# Patient Record
Sex: Male | Born: 1970 | Race: Black or African American | Hispanic: No | Marital: Single | Smoking: Current every day smoker
Health system: Southern US, Community
[De-identification: ages and names within clinical notes are randomized; demographics above are authoritative.]

## PROBLEM LIST (undated history)

## (undated) DIAGNOSIS — N189 Chronic kidney disease, unspecified: Secondary | ICD-10-CM

## (undated) DIAGNOSIS — M1990 Unspecified osteoarthritis, unspecified site: Secondary | ICD-10-CM

## (undated) DIAGNOSIS — S8290XA Unspecified fracture of unspecified lower leg, initial encounter for closed fracture: Secondary | ICD-10-CM

## (undated) DIAGNOSIS — M869 Osteomyelitis, unspecified: Secondary | ICD-10-CM

## (undated) DIAGNOSIS — I10 Essential (primary) hypertension: Secondary | ICD-10-CM

## (undated) DIAGNOSIS — I2699 Other pulmonary embolism without acute cor pulmonale: Secondary | ICD-10-CM

## (undated) DIAGNOSIS — Z9289 Personal history of other medical treatment: Secondary | ICD-10-CM

## (undated) DIAGNOSIS — I82409 Acute embolism and thrombosis of unspecified deep veins of unspecified lower extremity: Secondary | ICD-10-CM

## (undated) HISTORY — PX: KNEE HARDWARE REMOVAL: SUR1128

---
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
## 2012-10-10 ENCOUNTER — Emergency Department (HOSPITAL_COMMUNITY): Payer: Medicaid Other

## 2012-10-10 ENCOUNTER — Encounter (HOSPITAL_COMMUNITY): Payer: Self-pay

## 2012-10-10 ENCOUNTER — Inpatient Hospital Stay (HOSPITAL_COMMUNITY)
Admission: EM | Admit: 2012-10-10 | Discharge: 2012-10-24 | DRG: 464 | Disposition: A | Discharge status: Home Health | Payer: Medicaid Other | Attending: Orthopedic Surgery | Admitting: Orthopedic Surgery

## 2012-10-10 DIAGNOSIS — F1010 Alcohol abuse, uncomplicated: Secondary | ICD-10-CM | POA: Diagnosis present

## 2012-10-10 DIAGNOSIS — S82209A Unspecified fracture of shaft of unspecified tibia, initial encounter for closed fracture: Principal | ICD-10-CM | POA: Diagnosis present

## 2012-10-10 DIAGNOSIS — Y92410 Unspecified street and highway as the place of occurrence of the external cause: Secondary | ICD-10-CM

## 2012-10-10 DIAGNOSIS — I10 Essential (primary) hypertension: Secondary | ICD-10-CM | POA: Diagnosis present

## 2012-10-10 DIAGNOSIS — T79A29A Traumatic compartment syndrome of unspecified lower extremity, initial encounter: Secondary | ICD-10-CM | POA: Diagnosis present

## 2012-10-10 DIAGNOSIS — I517 Cardiomegaly: Secondary | ICD-10-CM

## 2012-10-10 DIAGNOSIS — Z9119 Patient's noncompliance with other medical treatment and regimen: Secondary | ICD-10-CM

## 2012-10-10 DIAGNOSIS — I2699 Other pulmonary embolism without acute cor pulmonale: Secondary | ICD-10-CM | POA: Diagnosis present

## 2012-10-10 DIAGNOSIS — E860 Dehydration: Secondary | ICD-10-CM | POA: Diagnosis present

## 2012-10-10 DIAGNOSIS — Z91199 Patient's noncompliance with other medical treatment and regimen due to unspecified reason: Secondary | ICD-10-CM

## 2012-10-10 DIAGNOSIS — Y998 Other external cause status: Secondary | ICD-10-CM

## 2012-10-10 DIAGNOSIS — I161 Hypertensive emergency: Secondary | ICD-10-CM | POA: Diagnosis present

## 2012-10-10 DIAGNOSIS — S82409A Unspecified fracture of shaft of unspecified fibula, initial encounter for closed fracture: Principal | ICD-10-CM | POA: Diagnosis present

## 2012-10-10 DIAGNOSIS — F1410 Cocaine abuse, uncomplicated: Secondary | ICD-10-CM | POA: Diagnosis present

## 2012-10-10 DIAGNOSIS — D62 Acute posthemorrhagic anemia: Secondary | ICD-10-CM | POA: Diagnosis not present

## 2012-10-10 DIAGNOSIS — S4350XA Sprain of unspecified acromioclavicular joint, initial encounter: Secondary | ICD-10-CM | POA: Diagnosis present

## 2012-10-10 DIAGNOSIS — Z86711 Personal history of pulmonary embolism: Secondary | ICD-10-CM

## 2012-10-10 DIAGNOSIS — N179 Acute kidney failure, unspecified: Secondary | ICD-10-CM | POA: Diagnosis present

## 2012-10-10 DIAGNOSIS — F17200 Nicotine dependence, unspecified, uncomplicated: Secondary | ICD-10-CM | POA: Diagnosis present

## 2012-10-10 DIAGNOSIS — S82202A Unspecified fracture of shaft of left tibia, initial encounter for closed fracture: Secondary | ICD-10-CM | POA: Diagnosis present

## 2012-10-10 HISTORY — DX: Essential (primary) hypertension: I10

## 2012-10-10 HISTORY — DX: Osteomyelitis, unspecified: M86.9

## 2012-10-10 HISTORY — DX: Acute embolism and thrombosis of unspecified deep veins of unspecified lower extremity: I82.409

## 2012-10-10 HISTORY — DX: Other pulmonary embolism without acute cor pulmonale: I26.99

## 2012-10-10 LAB — RAPID URINE DRUG SCREEN, HOSP PERFORMED
Amphetamines: NOT DETECTED
Opiates: NOT DETECTED

## 2012-10-10 LAB — POCT I-STAT, CHEM 8
Calcium, Ion: 1.06 mmol/L — ABNORMAL LOW (ref 1.12–1.23)
Chloride: 107 mEq/L (ref 96–112)
Glucose, Bld: 89 mg/dL (ref 70–99)
HCT: 45 % (ref 39.0–52.0)
Hemoglobin: 15.3 g/dL (ref 13.0–17.0)
TCO2: 24 mmol/L (ref 0–100)

## 2012-10-10 LAB — CBC WITH DIFFERENTIAL/PLATELET
Basophils Absolute: 0 10*3/uL (ref 0.0–0.1)
Basophils Relative: 0 % (ref 0–1)
Eosinophils Absolute: 0 10*3/uL (ref 0.0–0.7)
Eosinophils Relative: 0 % (ref 0–5)
HCT: 41.9 % (ref 39.0–52.0)
Lymphocytes Relative: 15 % (ref 12–46)
MCHC: 35.1 g/dL (ref 30.0–36.0)
MCV: 91.1 fL (ref 78.0–100.0)
Monocytes Absolute: 0.5 10*3/uL (ref 0.1–1.0)
Platelets: 197 10*3/uL (ref 150–400)
RDW: 12.7 % (ref 11.5–15.5)
WBC: 10.4 10*3/uL (ref 4.0–10.5)

## 2012-10-10 LAB — ETHANOL: Alcohol, Ethyl (B): 309 mg/dL — ABNORMAL HIGH (ref 0–11)

## 2012-10-10 MED ORDER — LORAZEPAM 1 MG PO TABS: 1.0000 mg | ORAL_TABLET | Freq: Four times a day (QID) | ORAL | Status: AC | PRN

## 2012-10-10 MED ORDER — SODIUM CHLORIDE 0.9 % IV SOLN
Freq: Once | INTRAVENOUS | Status: AC
  Administered 2012-10-10: 21:00:00 via INTRAVENOUS

## 2012-10-10 MED ORDER — FOLIC ACID 1 MG PO TABS
1.0000 mg | ORAL_TABLET | Freq: Every day | ORAL | Status: DC
  Administered 2012-10-11 – 2012-10-24 (×14): 1 mg via ORAL
  Filled 2012-10-10 (×14): qty 1

## 2012-10-10 MED ORDER — HYDROMORPHONE HCL PF 1 MG/ML IJ SOLN
1.0000 mg | Freq: Once | INTRAMUSCULAR | Status: AC
  Administered 2012-10-10: 1 mg via INTRAVENOUS
  Filled 2012-10-10: qty 1

## 2012-10-10 MED ORDER — FENTANYL CITRATE 0.05 MG/ML IJ SOLN
50.0000 ug | Freq: Once | INTRAMUSCULAR | Status: AC
  Administered 2012-10-10: 50 ug via INTRAVENOUS
  Filled 2012-10-10: qty 2

## 2012-10-10 MED ORDER — THIAMINE HCL 100 MG/ML IJ SOLN
100.0000 mg | Freq: Every day | INTRAMUSCULAR | Status: DC
  Filled 2012-10-10 (×11): qty 1

## 2012-10-10 MED ORDER — ONDANSETRON HCL 4 MG/2ML IJ SOLN
4.0000 mg | Freq: Once | INTRAMUSCULAR | Status: AC
  Administered 2012-10-10: 4 mg via INTRAVENOUS
  Filled 2012-10-10: qty 2

## 2012-10-10 MED ORDER — VITAMIN B-1 100 MG PO TABS
100.0000 mg | ORAL_TABLET | Freq: Every day | ORAL | Status: DC
  Administered 2012-10-11 – 2012-10-24 (×14): 100 mg via ORAL
  Filled 2012-10-10 (×14): qty 1

## 2012-10-10 MED ORDER — FENTANYL CITRATE 0.05 MG/ML IJ SOLN
50.0000 ug | Freq: Once | INTRAMUSCULAR | Status: AC
  Administered 2012-10-10: 50 ug via INTRAVENOUS

## 2012-10-10 MED ORDER — LORAZEPAM 2 MG/ML IJ SOLN
1.0000 mg | Freq: Four times a day (QID) | INTRAMUSCULAR | Status: AC | PRN
  Administered 2012-10-11 (×2): 1 mg via INTRAVENOUS
  Filled 2012-10-10 (×2): qty 1

## 2012-10-10 MED ORDER — IOHEXOL 300 MG/ML  SOLN
100.0000 mL | Freq: Once | INTRAMUSCULAR | Status: AC | PRN
  Administered 2012-10-10: 100 mL via INTRAVENOUS

## 2012-10-10 MED ORDER — ADULT MULTIVITAMIN W/MINERALS CH
1.0000 | ORAL_TABLET | Freq: Every day | ORAL | Status: DC
  Administered 2012-10-11 – 2012-10-24 (×14): 1 via ORAL
  Filled 2012-10-10 (×14): qty 1
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
## 2012-10-10 NOTE — ED Notes (Signed)
Ortho at bedside.

## 2012-10-10 NOTE — ED Notes (Signed)
CT called and informed us that pt is in severe pain that will alter the scans if he is not medicated

## 2012-10-10 NOTE — ED Notes (Signed)
MD at bedside. Dondra Spry ED NP

## 2012-10-10 NOTE — ED Notes (Signed)
Pt removed C-collar 
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
## 2012-10-10 NOTE — ED Provider Notes (Signed)
Medical screening examination/treatment/procedure(s) were conducted as a shared visit with non-physician practitioner(s) and myself.  I personally evaluated the patient during the encounter  Pt left foot with dopplerable pulses--ankle with obvious instability, will consult ortho, awaiting xray results  Toy Baker, MD 10/10/12 2055

## 2012-10-10 NOTE — Progress Notes (Signed)
Orthopedic Tech Progress Note Patient Details:  Edwin Rodriguez Jan 24, 1971 [id number]  Ortho Devices Type of Ortho Device: Ace wrap;Stirrup splint;Post (short) splint Splint Material: Fiberglass Ortho Device/Splint Location: (L) LE Ortho Device/Splint Interventions: Application   [name] 10/10/2012, 9:22 PM

## 2012-10-11 ENCOUNTER — Encounter (HOSPITAL_COMMUNITY): Payer: Self-pay | Admitting: Orthopedic Surgery

## 2012-10-11 ENCOUNTER — Inpatient Hospital Stay (HOSPITAL_COMMUNITY): Payer: Medicaid Other

## 2012-10-11 ENCOUNTER — Encounter (HOSPITAL_COMMUNITY)
Admission: EM | Disposition: A | Discharge status: Home Health | Payer: Self-pay | Source: Home / Self Care | Attending: Orthopedic Surgery

## 2012-10-11 ENCOUNTER — Encounter (HOSPITAL_COMMUNITY): Payer: Self-pay | Admitting: Anesthesiology

## 2012-10-11 ENCOUNTER — Inpatient Hospital Stay (HOSPITAL_COMMUNITY): Payer: Medicaid Other | Admitting: Anesthesiology

## 2012-10-11 DIAGNOSIS — F1410 Cocaine abuse, uncomplicated: Secondary | ICD-10-CM | POA: Diagnosis present

## 2012-10-11 DIAGNOSIS — N179 Acute kidney failure, unspecified: Secondary | ICD-10-CM | POA: Diagnosis present

## 2012-10-11 DIAGNOSIS — F1010 Alcohol abuse, uncomplicated: Secondary | ICD-10-CM | POA: Diagnosis present

## 2012-10-11 DIAGNOSIS — I517 Cardiomegaly: Secondary | ICD-10-CM | POA: Diagnosis present

## 2012-10-11 DIAGNOSIS — I2699 Other pulmonary embolism without acute cor pulmonale: Secondary | ICD-10-CM | POA: Diagnosis present

## 2012-10-11 DIAGNOSIS — I161 Hypertensive emergency: Secondary | ICD-10-CM | POA: Diagnosis present

## 2012-10-11 DIAGNOSIS — I10 Essential (primary) hypertension: Secondary | ICD-10-CM

## 2012-10-11 DIAGNOSIS — S82202A Unspecified fracture of shaft of left tibia, initial encounter for closed fracture: Secondary | ICD-10-CM | POA: Diagnosis present

## 2012-10-11 DIAGNOSIS — S82209A Unspecified fracture of shaft of unspecified tibia, initial encounter for closed fracture: Principal | ICD-10-CM

## 2012-10-11 HISTORY — PX: ORIF TIBIA FRACTURE: SHX5416

## 2012-10-11 LAB — CBC
Hemoglobin: 14 g/dL (ref 13.0–17.0)
MCH: 31.5 pg (ref 26.0–34.0)
MCHC: 34.7 g/dL (ref 30.0–36.0)
RDW: 12.9 % (ref 11.5–15.5)

## 2012-10-11 LAB — HEPATIC FUNCTION PANEL
ALT: 29 U/L (ref 0–53)
AST: 50 U/L — ABNORMAL HIGH (ref 0–37)
Albumin: 3.7 g/dL (ref 3.5–5.2)
Alkaline Phosphatase: 67 U/L (ref 39–117)
Bilirubin, Direct: <0.1 mg/dL (ref 0.0–0.3)
Total Bilirubin: 0.3 mg/dL (ref 0.3–1.2)

## 2012-10-11 LAB — SURGICAL PCR SCREEN: Staphylococcus aureus: NEGATIVE

## 2012-10-11 LAB — BASIC METABOLIC PANEL
BUN: 6 mg/dL (ref 6–23)
Creatinine, Ser: 1.01 mg/dL (ref 0.50–1.35)
GFR calc Af Amer: >90 mL/min (ref >90)
GFR calc non Af Amer: >90 mL/min (ref >90)
Glucose, Bld: 86 mg/dL (ref 70–99)
Potassium: 3.9 mEq/L (ref 3.5–5.1)

## 2012-10-11 LAB — TROPONIN I: Troponin I: <0.3 ng/mL (ref <0.30)

## 2012-10-11 LAB — PROTIME-INR: Prothrombin Time: 13.5 seconds (ref 11.6–15.2)

## 2012-10-11 SURGERY — OPEN REDUCTION INTERNAL FIXATION (ORIF) TIBIA FRACTURE
Anesthesia: General | Site: Leg Lower | Laterality: Left | Wound class: Contaminated

## 2012-10-11 MED ORDER — MAGNESIUM CITRATE PO SOLN: 1.0000 | Freq: Once | ORAL | Status: AC | PRN

## 2012-10-11 MED ORDER — ACETAMINOPHEN 650 MG RE SUPP: 650.0000 mg | Freq: Four times a day (QID) | RECTAL | Status: DC | PRN

## 2012-10-11 MED ORDER — OXYCODONE HCL 5 MG PO TABS: 5.0000 mg | ORAL_TABLET | Freq: Once | ORAL | Status: AC | PRN

## 2012-10-11 MED ORDER — METHOCARBAMOL 100 MG/ML IJ SOLN: 500.0000 mg | Freq: Four times a day (QID) | INTRAVENOUS | Status: DC | PRN

## 2012-10-11 MED ORDER — PROPOFOL 10 MG/ML IV BOLUS
INTRAVENOUS | Status: DC | PRN
  Administered 2012-10-11: 200 mg via INTRAVENOUS
  Administered 2012-10-11: 30 mg via INTRAVENOUS

## 2012-10-11 MED ORDER — CEFAZOLIN SODIUM-DEXTROSE 2-3 GM-% IV SOLR
INTRAVENOUS | Status: DC | PRN
  Administered 2012-10-11: 2 g via INTRAVENOUS

## 2012-10-11 MED ORDER — DIPHENHYDRAMINE HCL 12.5 MG/5ML PO ELIX
12.5000 mg | ORAL_SOLUTION | Freq: Four times a day (QID) | ORAL | Status: DC | PRN
  Filled 2012-10-11: qty 5

## 2012-10-11 MED ORDER — LIDOCAINE HCL (CARDIAC) 20 MG/ML IV SOLN
INTRAVENOUS | Status: DC | PRN
  Administered 2012-10-11: 80 mg via INTRAVENOUS

## 2012-10-11 MED ORDER — ENOXAPARIN SODIUM 40 MG/0.4ML ~~LOC~~ SOLN
40.0000 mg | SUBCUTANEOUS | Status: DC
  Administered 2012-10-12 – 2012-10-24 (×11): 40 mg via SUBCUTANEOUS
  Filled 2012-10-11 (×15): qty 0.4

## 2012-10-11 MED ORDER — ONDANSETRON HCL 4 MG/2ML IJ SOLN: 4.0000 mg | Freq: Four times a day (QID) | INTRAMUSCULAR | Status: DC | PRN

## 2012-10-11 MED ORDER — HYDROMORPHONE 0.3 MG/ML IV SOLN
INTRAVENOUS | Status: DC
  Administered 2012-10-11: 22:00:00 via INTRAVENOUS
  Administered 2012-10-12 (×2): 2.7 mg via INTRAVENOUS
  Administered 2012-10-12: 18:00:00 via INTRAVENOUS
  Administered 2012-10-12: 4.8 mg via INTRAVENOUS
  Administered 2012-10-12: 08:00:00 via INTRAVENOUS
  Administered 2012-10-12: 3 mg via INTRAVENOUS
  Administered 2012-10-12: 0.6 mg via INTRAVENOUS
  Administered 2012-10-12: 2.7 mg via INTRAVENOUS
  Administered 2012-10-13: 14:00:00 via INTRAVENOUS
  Administered 2012-10-13: 3 mg via INTRAVENOUS
  Administered 2012-10-13: 04:00:00 via INTRAVENOUS
  Administered 2012-10-13 (×2): 3.3 mg via INTRAVENOUS
  Administered 2012-10-13: 1.8 mg via INTRAVENOUS
  Administered 2012-10-13: 21:00:00 via INTRAVENOUS
  Administered 2012-10-14: 5.4 mg via INTRAVENOUS
  Administered 2012-10-14: 10:00:00 via INTRAVENOUS
  Filled 2012-10-11 (×5): qty 25

## 2012-10-11 MED ORDER — FENTANYL CITRATE 0.05 MG/ML IJ SOLN
INTRAMUSCULAR | Status: DC | PRN
  Administered 2012-10-11 (×3): 100 ug via INTRAVENOUS
  Administered 2012-10-11 (×2): 50 ug via INTRAVENOUS

## 2012-10-11 MED ORDER — HYDROMORPHONE 0.3 MG/ML IV SOLN
INTRAVENOUS | Status: AC
  Filled 2012-10-11: qty 25

## 2012-10-11 MED ORDER — ACETAMINOPHEN 10 MG/ML IV SOLN
1000.0000 mg | Freq: Four times a day (QID) | INTRAVENOUS | Status: AC
  Administered 2012-10-11 – 2012-10-12 (×4): 1000 mg via INTRAVENOUS
  Filled 2012-10-11 (×5): qty 100

## 2012-10-11 MED ORDER — 0.9 % SODIUM CHLORIDE (POUR BTL) OPTIME
TOPICAL | Status: DC | PRN
  Administered 2012-10-11: 1000 mL

## 2012-10-11 MED ORDER — DOCUSATE SODIUM 100 MG PO CAPS
100.0000 mg | ORAL_CAPSULE | Freq: Two times a day (BID) | ORAL | Status: DC
  Administered 2012-10-11 – 2012-10-24 (×25): 100 mg via ORAL
  Filled 2012-10-11 (×26): qty 1

## 2012-10-11 MED ORDER — DIPHENHYDRAMINE HCL 50 MG/ML IJ SOLN: 12.5000 mg | Freq: Four times a day (QID) | INTRAMUSCULAR | Status: DC | PRN

## 2012-10-11 MED ORDER — OXYCODONE HCL 5 MG/5ML PO SOLN: 5.0000 mg | Freq: Once | ORAL | Status: AC | PRN

## 2012-10-11 MED ORDER — ACETAMINOPHEN 325 MG PO TABS
650.0000 mg | ORAL_TABLET | Freq: Four times a day (QID) | ORAL | Status: DC | PRN
  Administered 2012-10-14 – 2012-10-17 (×4): 650 mg via ORAL
  Filled 2012-10-11 (×4): qty 2
  Filled 2012-10-11: qty 1
  Filled 2012-10-11: qty 2

## 2012-10-11 MED ORDER — DIPHENHYDRAMINE HCL 12.5 MG/5ML PO ELIX
12.5000 mg | ORAL_SOLUTION | ORAL | Status: DC | PRN
  Administered 2012-10-12 – 2012-10-24 (×12): 25 mg via ORAL
  Filled 2012-10-11: qty 5
  Filled 2012-10-11: qty 10
  Filled 2012-10-11 (×3): qty 5
  Filled 2012-10-11: qty 10
  Filled 2012-10-11 (×5): qty 5

## 2012-10-11 MED ORDER — ROCURONIUM BROMIDE 100 MG/10ML IV SOLN
INTRAVENOUS | Status: DC | PRN
  Administered 2012-10-11: 5 mg via INTRAVENOUS
  Administered 2012-10-11: 25 mg via INTRAVENOUS
  Administered 2012-10-11: 50 mg via INTRAVENOUS
  Administered 2012-10-11: 10 mg via INTRAVENOUS

## 2012-10-11 MED ORDER — LABETALOL HCL 5 MG/ML IV SOLN
INTRAVENOUS | Status: AC
  Administered 2012-10-11: 10 mg
  Filled 2012-10-11: qty 4

## 2012-10-11 MED ORDER — ONDANSETRON HCL 4 MG PO TABS: 4.0000 mg | ORAL_TABLET | Freq: Four times a day (QID) | ORAL | Status: DC | PRN

## 2012-10-11 MED ORDER — LACTATED RINGERS IV SOLN
INTRAVENOUS | Status: DC | PRN
  Administered 2012-10-11 (×3): via INTRAVENOUS

## 2012-10-11 MED ORDER — DOCUSATE SODIUM 100 MG PO CAPS
100.0000 mg | ORAL_CAPSULE | Freq: Two times a day (BID) | ORAL | Status: DC
  Administered 2012-10-11 – 2012-10-14 (×7): 100 mg via ORAL
  Filled 2012-10-11 (×8): qty 1

## 2012-10-11 MED ORDER — CLONIDINE HCL 0.3 MG PO TABS
0.3000 mg | ORAL_TABLET | Freq: Two times a day (BID) | ORAL | Status: DC
  Administered 2012-10-11 – 2012-10-12 (×2): 0.3 mg via ORAL
  Filled 2012-10-11 (×3): qty 1

## 2012-10-11 MED ORDER — CEFAZOLIN SODIUM-DEXTROSE 2-3 GM-% IV SOLR
INTRAVENOUS | Status: AC
  Filled 2012-10-11: qty 50

## 2012-10-11 MED ORDER — MIDAZOLAM HCL 5 MG/5ML IJ SOLN
INTRAMUSCULAR | Status: DC | PRN
  Administered 2012-10-11: 2 mg via INTRAVENOUS

## 2012-10-11 MED ORDER — HYDROMORPHONE HCL PF 1 MG/ML IJ SOLN: 0.2500 mg | INTRAMUSCULAR | Status: DC | PRN

## 2012-10-11 MED ORDER — ARTIFICIAL TEARS OP OINT
TOPICAL_OINTMENT | OPHTHALMIC | Status: DC | PRN
  Administered 2012-10-11: 1 via OPHTHALMIC

## 2012-10-11 MED ORDER — OXYCODONE HCL 5 MG PO TABS
10.0000 mg | ORAL_TABLET | ORAL | Status: DC | PRN
  Administered 2012-10-11: 10 mg via ORAL
  Filled 2012-10-11: qty 2

## 2012-10-11 MED ORDER — IOHEXOL 350 MG/ML SOLN
100.0000 mL | Freq: Once | INTRAVENOUS | Status: AC | PRN
  Administered 2012-10-11: 100 mL via INTRAVENOUS

## 2012-10-11 MED ORDER — BISACODYL 10 MG RE SUPP: 10.0000 mg | Freq: Every day | RECTAL | Status: DC | PRN

## 2012-10-11 MED ORDER — HYDROMORPHONE HCL PF 1 MG/ML IJ SOLN
INTRAMUSCULAR | Status: AC
  Administered 2012-10-11: 1 mg
  Filled 2012-10-11: qty 1

## 2012-10-11 MED ORDER — HYDRALAZINE HCL 20 MG/ML IJ SOLN
10.0000 mg | Freq: Four times a day (QID) | INTRAMUSCULAR | Status: DC | PRN
  Administered 2012-10-11: 10 mg via INTRAVENOUS
  Filled 2012-10-11: qty 0.5

## 2012-10-11 MED ORDER — ISOSORBIDE MONONITRATE ER 30 MG PO TB24
30.0000 mg | ORAL_TABLET | Freq: Every day | ORAL | Status: DC
  Administered 2012-10-11 – 2012-10-22 (×12): 30 mg via ORAL
  Filled 2012-10-11 (×12): qty 1

## 2012-10-11 MED ORDER — METHOCARBAMOL 500 MG PO TABS
500.0000 mg | ORAL_TABLET | Freq: Four times a day (QID) | ORAL | Status: DC | PRN
  Administered 2012-10-14 – 2012-10-17 (×9): 500 mg via ORAL
  Administered 2012-10-18: 1000 mg via ORAL
  Administered 2012-10-18: 500 mg via ORAL
  Administered 2012-10-19 (×2): 1000 mg via ORAL
  Administered 2012-10-20: 500 mg via ORAL
  Administered 2012-10-20: 1000 mg via ORAL
  Administered 2012-10-20 (×3): 500 mg via ORAL
  Administered 2012-10-21 – 2012-10-24 (×10): 1000 mg via ORAL
  Filled 2012-10-11 (×3): qty 2
  Filled 2012-10-11 (×3): qty 1
  Filled 2012-10-11: qty 2
  Filled 2012-10-11: qty 1
  Filled 2012-10-11: qty 2
  Filled 2012-10-11: qty 1
  Filled 2012-10-11 (×6): qty 2
  Filled 2012-10-11 (×5): qty 1
  Filled 2012-10-11: qty 2
  Filled 2012-10-11: qty 1
  Filled 2012-10-11: qty 2
  Filled 2012-10-11: qty 1
  Filled 2012-10-11: qty 2
  Filled 2012-10-11 (×2): qty 1
  Filled 2012-10-11 (×2): qty 2
  Filled 2012-10-11: qty 1

## 2012-10-11 MED ORDER — ALUM & MAG HYDROXIDE-SIMETH 200-200-20 MG/5ML PO SUSP
30.0000 mL | Freq: Four times a day (QID) | ORAL | Status: DC | PRN
  Administered 2012-10-13: 30 mL via ORAL
  Filled 2012-10-11: qty 30

## 2012-10-11 MED ORDER — NALOXONE HCL 0.4 MG/ML IJ SOLN: 0.4000 mg | INTRAMUSCULAR | Status: DC | PRN

## 2012-10-11 MED ORDER — THIAMINE HCL 100 MG/ML IJ SOLN
Freq: Once | INTRAVENOUS | Status: AC
  Administered 2012-10-11: 01:00:00 via INTRAVENOUS
  Filled 2012-10-11: qty 1000

## 2012-10-11 MED ORDER — LABETALOL HCL 5 MG/ML IV SOLN
10.0000 mg | Freq: Once | INTRAVENOUS | Status: AC
  Administered 2012-10-11: 10 mg via INTRAVENOUS

## 2012-10-11 MED ORDER — SODIUM CHLORIDE 0.9 % IV BOLUS (SEPSIS)
1000.0000 mL | Freq: Once | INTRAVENOUS | Status: AC
  Administered 2012-10-11: 1000 mL via INTRAVENOUS

## 2012-10-11 MED ORDER — LABETALOL HCL 5 MG/ML IV SOLN
INTRAVENOUS | Status: DC | PRN
  Administered 2012-10-11 (×4): 5 mg via INTRAVENOUS

## 2012-10-11 MED ORDER — OXYCODONE HCL 5 MG PO TABS
5.0000 mg | ORAL_TABLET | ORAL | Status: DC | PRN
  Administered 2012-10-14 – 2012-10-15 (×4): 10 mg via ORAL
  Administered 2012-10-15: 15 mg via ORAL
  Administered 2012-10-15: 20 mg via ORAL
  Administered 2012-10-15 (×2): 10 mg via ORAL
  Administered 2012-10-16 (×3): 20 mg via ORAL
  Administered 2012-10-16 (×2): 10 mg via ORAL
  Administered 2012-10-16: 15 mg via ORAL
  Administered 2012-10-16: 5 mg via ORAL
  Administered 2012-10-17 – 2012-10-18 (×3): 20 mg via ORAL
  Administered 2012-10-18: 15 mg via ORAL
  Administered 2012-10-19 – 2012-10-20 (×7): 20 mg via ORAL
  Administered 2012-10-20 (×3): 10 mg via ORAL
  Administered 2012-10-20 – 2012-10-24 (×26): 20 mg via ORAL
  Filled 2012-10-11 (×2): qty 4
  Filled 2012-10-11: qty 2
  Filled 2012-10-11 (×9): qty 4
  Filled 2012-10-11: qty 2
  Filled 2012-10-11 (×3): qty 4
  Filled 2012-10-11: qty 2
  Filled 2012-10-11: qty 3
  Filled 2012-10-11 (×2): qty 2
  Filled 2012-10-11 (×4): qty 4
  Filled 2012-10-11: qty 3
  Filled 2012-10-11 (×4): qty 4
  Filled 2012-10-11: qty 1
  Filled 2012-10-11 (×2): qty 4
  Filled 2012-10-11: qty 2
  Filled 2012-10-11 (×5): qty 4
  Filled 2012-10-11: qty 2
  Filled 2012-10-11: qty 4
  Filled 2012-10-11: qty 2
  Filled 2012-10-11 (×3): qty 4
  Filled 2012-10-11: qty 2
  Filled 2012-10-11 (×2): qty 4
  Filled 2012-10-11 (×3): qty 2
  Filled 2012-10-11 (×6): qty 4

## 2012-10-11 MED ORDER — SENNOSIDES-DOCUSATE SODIUM 8.6-50 MG PO TABS
1.0000 | ORAL_TABLET | Freq: Every evening | ORAL | Status: DC | PRN
  Administered 2012-10-16: 1 via ORAL
  Filled 2012-10-11: qty 1

## 2012-10-11 MED ORDER — LACTATED RINGERS IV SOLN
INTRAVENOUS | Status: DC
  Administered 2012-10-11 – 2012-10-13 (×4): via INTRAVENOUS
  Administered 2012-10-14: 20 mL/h via INTRAVENOUS
  Administered 2012-10-15: via INTRAVENOUS
  Administered 2012-10-17: 50 mL/h via INTRAVENOUS

## 2012-10-11 MED ORDER — LABETALOL HCL 5 MG/ML IV SOLN
INTRAVENOUS | Status: AC
  Filled 2012-10-11: qty 4

## 2012-10-11 MED ORDER — DEXTROSE 5 % IV SOLN
INTRAVENOUS | Status: DC | PRN
  Administered 2012-10-11: 15:00:00 via INTRAVENOUS

## 2012-10-11 MED ORDER — METOCLOPRAMIDE HCL 10 MG PO TABS: 5.0000 mg | ORAL_TABLET | Freq: Three times a day (TID) | ORAL | Status: DC | PRN

## 2012-10-11 MED ORDER — GLYCOPYRROLATE 0.2 MG/ML IJ SOLN
INTRAMUSCULAR | Status: DC | PRN
  Administered 2012-10-11: .8 mg via INTRAVENOUS

## 2012-10-11 MED ORDER — NEOSTIGMINE METHYLSULFATE 1 MG/ML IJ SOLN
INTRAMUSCULAR | Status: DC | PRN
  Administered 2012-10-11: 5 mg via INTRAVENOUS

## 2012-10-11 MED ORDER — METOCLOPRAMIDE HCL 5 MG/ML IJ SOLN: 5.0000 mg | Freq: Three times a day (TID) | INTRAMUSCULAR | Status: DC | PRN

## 2012-10-11 MED ORDER — HYDROMORPHONE HCL PF 1 MG/ML IJ SOLN
0.5000 mg | INTRAMUSCULAR | Status: DC | PRN
  Administered 2012-10-16 – 2012-10-19 (×6): 1 mg via INTRAVENOUS
  Filled 2012-10-11 (×7): qty 1

## 2012-10-11 MED ORDER — SODIUM CHLORIDE 0.9 % IJ SOLN: 9.0000 mL | INTRAMUSCULAR | Status: DC | PRN

## 2012-10-11 MED ORDER — MORPHINE SULFATE 2 MG/ML IJ SOLN
2.0000 mg | INTRAMUSCULAR | Status: DC | PRN
  Administered 2012-10-11 (×4): 4 mg via INTRAVENOUS
  Administered 2012-10-11: 2 mg via INTRAVENOUS
  Administered 2012-10-11: 4 mg via INTRAVENOUS
  Filled 2012-10-11: qty 2
  Filled 2012-10-11 (×2): qty 1
  Filled 2012-10-11: qty 2
  Filled 2012-10-11: qty 1
  Filled 2012-10-11 (×2): qty 2

## 2012-10-11 MED ORDER — CARVEDILOL 6.25 MG PO TABS
6.2500 mg | ORAL_TABLET | Freq: Two times a day (BID) | ORAL | Status: DC
  Administered 2012-10-11 – 2012-10-24 (×25): 6.25 mg via ORAL
  Filled 2012-10-11 (×28): qty 1

## 2012-10-11 MED ORDER — CEFAZOLIN SODIUM 1-5 GM-% IV SOLN
1.0000 g | Freq: Three times a day (TID) | INTRAVENOUS | Status: DC
  Administered 2012-10-11 – 2012-10-24 (×37): 1 g via INTRAVENOUS
  Filled 2012-10-11 (×42): qty 50

## 2012-10-11 SURGICAL SUPPLY — 76 items
BANDAGE ELASTIC 3 VELCRO ST LF (GAUZE/BANDAGES/DRESSINGS) ×2 IMPLANT
BANDAGE ELASTIC 4 VELCRO ST LF (GAUZE/BANDAGES/DRESSINGS) ×4 IMPLANT
BANDAGE ELASTIC 6 VELCRO ST LF (GAUZE/BANDAGES/DRESSINGS) ×2 IMPLANT
BANDAGE ESMARK 6X9 LF (GAUZE/BANDAGES/DRESSINGS) ×1 IMPLANT
BANDAGE GAUZE ELAST BULKY 4 IN (GAUZE/BANDAGES/DRESSINGS) ×4 IMPLANT
BLADE SURG 10 STRL SS (BLADE) ×2 IMPLANT
BLADE SURG 15 STRL LF DISP TIS (BLADE) ×1 IMPLANT
BLADE SURG 15 STRL SS (BLADE) ×1
BLADE SURG ROTATE 9660 (MISCELLANEOUS) IMPLANT
BNDG COHESIVE 4X5 TAN STRL (GAUZE/BANDAGES/DRESSINGS) ×2 IMPLANT
BNDG ESMARK 6X9 LF (GAUZE/BANDAGES/DRESSINGS) ×2
BRUSH SCRUB DISP (MISCELLANEOUS) ×4 IMPLANT
CLAMP BLUE BAR TO BAR (MISCELLANEOUS) ×4 IMPLANT
CLAMP BLUE BAR TO PIN (MISCELLANEOUS) ×8 IMPLANT
CLOTH BEACON ORANGE TIMEOUT ST (SAFETY) ×2 IMPLANT
COVER MAYO STAND STRL (DRAPES) ×2 IMPLANT
DRAPE C-ARM 42X72 X-RAY (DRAPES) ×2 IMPLANT
DRAPE C-ARMOR (DRAPES) ×2 IMPLANT
DRAPE INCISE IOBAN 66X45 STRL (DRAPES) ×2 IMPLANT
DRAPE ORTHO SPLIT 77X108 STRL (DRAPES)
DRAPE SURG ORHT 6 SPLT 77X108 (DRAPES) IMPLANT
DRAPE U-SHAPE 47X51 STRL (DRAPES) ×2 IMPLANT
DRSG ADAPTIC 3X8 NADH LF (GAUZE/BANDAGES/DRESSINGS) ×2 IMPLANT
DRSG PAD ABDOMINAL 8X10 ST (GAUZE/BANDAGES/DRESSINGS) ×8 IMPLANT
DRSG VAC ATS LRG SENSATRAC (GAUZE/BANDAGES/DRESSINGS) ×2 IMPLANT
DRSG VAC ATS MED SENSATRAC (GAUZE/BANDAGES/DRESSINGS) ×2 IMPLANT
ELECT REM PT RETURN 9FT ADLT (ELECTROSURGICAL) ×2
ELECTRODE REM PT RTRN 9FT ADLT (ELECTROSURGICAL) ×1 IMPLANT
EVACUATOR 1/8 PVC DRAIN (DRAIN) IMPLANT
EVACUATOR 3/16  PVC DRAIN (DRAIN)
EVACUATOR 3/16 PVC DRAIN (DRAIN) IMPLANT
GLOVE BIO SURGEON STRL SZ7.5 (GLOVE) ×2 IMPLANT
GLOVE BIO SURGEON STRL SZ8 (GLOVE) ×2 IMPLANT
GLOVE BIOGEL PI IND STRL 7.5 (GLOVE) ×1 IMPLANT
GLOVE BIOGEL PI IND STRL 8 (GLOVE) ×1 IMPLANT
GLOVE BIOGEL PI INDICATOR 7.5 (GLOVE) ×1
GLOVE BIOGEL PI INDICATOR 8 (GLOVE) ×1
GOWN PREVENTION PLUS XLARGE (GOWN DISPOSABLE) ×2 IMPLANT
GOWN STRL NON-REIN LRG LVL3 (GOWN DISPOSABLE) ×4 IMPLANT
HALF PIN 3MM (PIN) ×4 IMPLANT
HALF PIN 5.0X160 (PIN) ×4 IMPLANT
IMMOBILIZER KNEE 22 UNIV (SOFTGOODS) ×2 IMPLANT
KIT BASIN OR (CUSTOM PROCEDURE TRAY) ×2 IMPLANT
KIT ROOM TURNOVER OR (KITS) ×2 IMPLANT
MANIFOLD NEPTUNE II (INSTRUMENTS) ×2 IMPLANT
NDL SUT .5 MAYO 1.404X.05X (NEEDLE) IMPLANT
NEEDLE 22X1 1/2 (OR ONLY) (NEEDLE) IMPLANT
NEEDLE MAYO TAPER (NEEDLE)
NS IRRIG 1000ML POUR BTL (IV SOLUTION) ×2 IMPLANT
PACK ORTHO EXTREMITY (CUSTOM PROCEDURE TRAY) ×2 IMPLANT
PAD ARMBOARD 7.5X6 YLW CONV (MISCELLANEOUS) ×4 IMPLANT
PAD CAST 4YDX4 CTTN HI CHSV (CAST SUPPLIES) ×1 IMPLANT
PADDING CAST COTTON 4X4 STRL (CAST SUPPLIES) ×1
PADDING CAST COTTON 6X4 STRL (CAST SUPPLIES) ×2 IMPLANT
PIN CLAMP 2BAR 75MM BLUE (PIN) ×2 IMPLANT
PIN TRANSFIXING 5.0 (PIN) ×2 IMPLANT
SPONGE GAUZE 4X4 12PLY (GAUZE/BANDAGES/DRESSINGS) ×2 IMPLANT
SPONGE LAP 18X18 X RAY DECT (DISPOSABLE) ×2 IMPLANT
STAPLER VISISTAT 35W (STAPLE) ×2 IMPLANT
STOCKINETTE IMPERVIOUS LG (DRAPES) ×2 IMPLANT
SUCTION FRAZIER TIP 10 FR DISP (SUCTIONS) ×2 IMPLANT
SUT ETHILON 3 0 PS 1 (SUTURE) IMPLANT
SUT PROLENE 0 CT 2 (SUTURE) ×4 IMPLANT
SUT VIC AB 0 CT1 27 (SUTURE) ×1
SUT VIC AB 0 CT1 27XBRD ANBCTR (SUTURE) ×1 IMPLANT
SUT VIC AB 1 CT1 27 (SUTURE) ×1
SUT VIC AB 1 CT1 27XBRD ANBCTR (SUTURE) ×1 IMPLANT
SUT VIC AB 2-0 CT1 27 (SUTURE) ×2
SUT VIC AB 2-0 CT1 TAPERPNT 27 (SUTURE) ×2 IMPLANT
SYR 20ML ECCENTRIC (SYRINGE) IMPLANT
TOWEL OR 17X24 6PK STRL BLUE (TOWEL DISPOSABLE) ×2 IMPLANT
TOWEL OR 17X26 10 PK STRL BLUE (TOWEL DISPOSABLE) ×4 IMPLANT
TRAY FOLEY CATH 14FR (SET/KITS/TRAYS/PACK) IMPLANT
TUBE CONNECTING 12X1/4 (SUCTIONS) ×2 IMPLANT
WATER STERILE IRR 1000ML POUR (IV SOLUTION) ×4 IMPLANT
YANKAUER SUCT BULB TIP NO VENT (SUCTIONS) ×2 IMPLANT

## 2012-10-11 NOTE — Anesthesia Procedure Notes (Signed)
Procedure Name: Intubation Date/Time: 10/11/2012 3:11 PM Performed by: Darcey Nora B Pre-anesthesia Checklist: Patient identified, Emergency Drugs available, Suction available and Patient being monitored Patient Re-evaluated:Patient Re-evaluated prior to inductionOxygen Delivery Method: Circle system utilized Preoxygenation: Pre-oxygenation with 100% oxygen Intubation Type: IV induction Ventilation: Mask ventilation without difficulty Laryngoscope Size: Mac and 4 Grade View: Grade II Tube type: Oral Tube size: 7.5 mm Number of attempts: 1 Airway Equipment and Method: Stylet Placement Confirmation: ETT inserted through vocal cords under direct vision,  breath sounds checked- equal and bilateral and positive ETCO2 (very deep) Secured at: 24 (cm at teeth) cm Tube secured with: Tape Dental Injury: Teeth and Oropharynx as per pre-operative assessment

## 2012-10-11 NOTE — Anesthesia Preprocedure Evaluation (Signed)
Anesthesia Evaluation  Patient identified by MRN, date of birth, ID band Patient awake    Reviewed: Allergy & Precautions, H&P , NPO status , Patient's Chart, lab work & pertinent test results  Airway Mallampati: II  Neck ROM: full    Dental   Pulmonary  H/o PE         Cardiovascular hypertension,     Neuro/Psych    GI/Hepatic   Endo/Other    Renal/GU      Musculoskeletal   Abdominal   Peds  Hematology   Anesthesia Other Findings   Reproductive/Obstetrics                           Anesthesia Physical Anesthesia Plan  ASA: II  Anesthesia Plan: General   Post-op Pain Management:    Induction: Intravenous  Airway Management Planned: Oral ETT  Additional Equipment:   Intra-op Plan:   Post-operative Plan: Extubation in OR  Informed Consent: I have reviewed the patients History and Physical, chart, labs and discussed the procedure including the risks, benefits and alternatives for the proposed anesthesia with the patient or authorized representative who has indicated his/her understanding and acceptance.     Plan Discussed with: CRNA and Surgeon  Anesthesia Plan Comments:         Anesthesia Quick Evaluation
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
## 2012-10-11 NOTE — Preoperative (Addendum)
Beta Blockers   Reason not to administer Beta Blockers:Not Applicable 

## 2012-10-11 NOTE — Progress Notes (Signed)
Pt arrived to unit from ED at 0030 via stretcher. Per report BP elevated between 150's-190's/110's-130's. Per ED report no interventions taken, elevation attributed to pain level. BP once on unit 188/124, in 10 out of 10 pain, anxious. Morphine and Ativan given per orders. BP at 0241 186/111, HR 95, O2 Sat 99% on room air. No c/o chest pain, no dizziness, no dyspnea. Jiles Harold, PA notified of findings. No new orders received but to monitor pt and contact her again along with rapid response if continues to rise. Will continue to monitor.

## 2012-10-11 NOTE — Progress Notes (Signed)
PATIENT ID: Donne Hazel     Procedure(s) (LRB): OPEN REDUCTION INTERNAL FIXATION (ORIF) TIBIA FRACTURE (Left)  Subjective: No new complaints. Pain well controlled. Hospitalist service was consult at overnight secondary to uncontrolled hypertension. He also was noted to have an elevated creatinine. They are concerned about the possibility of a pulmonary embolism given his history and his hypoxia. His blood pressure has responded to IV hydralazine.  Objective:  Filed Vitals:   10/11/12 0706  BP: 168/98  Pulse: 93  Temp:   Resp:      Left lower extremity splint intact. Compartments compressible. Wiggles toes. No pain with passive stretch of toes.  Labs:   [facility] 10/11/12 0430 10/10/12 2057 10/10/12 2045  HGB 14.0 15.3 14.7   Basename 10/11/12 0430 10/10/12 2057 10/10/12 2045  WBC 8.4 -- 10.4  RBC 4.44 -- 4.60  HCT 40.4 45.0 --  PLT 180 -- 197   Basename 10/11/12 0430 10/10/12 2057  NA 141 142  K 3.9 4.3  CL 106 107  CO2 24 --  BUN 6 8  CREATININE 1.01 1.60*  GLUCOSE 86 89  CALCIUM 8.6 --    Assessment and Plan: Left comminuted distal tibia and fibula fracture Hypertension and elevated creatinine: Appreciate medical management, creatinine has normalized Will defer to medical team regarding workup of possible pulmonary embolism. He will remain n.p.o. for now, and hopefully will be able to go to surgery today with Dr. Carola Frost for definitive fixation of his fractures.  VTE proph: SCDs preoperatively
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
## 2012-10-11 NOTE — Anesthesia Postprocedure Evaluation (Signed)
Anesthesia Post Note  Patient: Edwin Rodriguez  Procedure(s) Performed: Procedure(s) (LRB): OPEN REDUCTION INTERNAL FIXATION (ORIF) TIBIA FRACTURE (Left)  Anesthesia type: General  Patient location: PACU  Post pain: Pain level controlled and Adequate analgesia  Post assessment: Post-op Vital signs reviewed, Patient's Cardiovascular Status Stable, Respiratory Function Stable, Patent Airway and Pain level controlled  Last Vitals:  Filed Vitals:   10/11/12 1745  BP:   Pulse:   Temp: 36.4 C  Resp:     Post vital signs: Reviewed and stable  Level of consciousness: awake, alert  and oriented  Complications: No apparent anesthesia complications

## 2012-10-11 NOTE — ED Notes (Signed)
BP 160/101 known by team.

## 2012-10-11 NOTE — Transfer of Care (Signed)
Immediate Anesthesia Transfer of Care Note  Patient: Edwin Rodriguez  Procedure(s) Performed: Procedure(s) (LRB) with comments: OPEN REDUCTION INTERNAL FIXATION (ORIF) TIBIA FRACTURE (Left)  Patient Location: PACU  Anesthesia Type:General  Level of Consciousness: awake, alert  and oriented  Airway & Oxygen Therapy: Patient Spontanous Breathing  Post-op Assessment: Report given to PACU RN and Post -op Vital signs reviewed and stable  Post vital signs: Reviewed and stable  Complications: No apparent anesthesia complications

## 2012-10-11 NOTE — Consult Note (Signed)
I have seen and examined the patient. I agree with the findings above.   Since earlier evaluation, sensation has decreased considerably and blisters have formed on the dorsum of the foot.  TN sens remains intact.  Pain has not worsened, however, and he has no pain with passive stretch of his great and lesser toes.  I discussed with the patient the risks and benefits of surgery with spanning external fixation and probable compartment release with or without ORIF of the fibula, including the possibility of infection, nerve injury, vessel injury, wound breakdown, arthritis, symptomatic hardware, DVT/ PE, loss of motion, and need for further surgery among others.  We also specifically discussed the need to stage surgery because of the elevated risk of soft tissue breakdown that could lead to amputation.  He understood these risks and wished to proceed.   Budd Palmer, MD 10/11/2012 2:08 PM

## 2012-10-11 NOTE — Progress Notes (Signed)
UR COMPLETED  

## 2012-10-11 NOTE — Brief Op Note (Signed)
10/10/2012 - 10/11/2012  5:44 PM  PATIENT:  Edwin Rodriguez  41 y.o. male  PRE-OPERATIVE DIAGNOSIS:  Closed fracture of left tibia and fibula [823.82], partial ankle dislocation  POST-OPERATIVE DIAGNOSIS:  Closed fracture of left tibia and fibula [823.82], partial ankle dislocation  PROCEDURE:   1. Spanning external fixation left ankle 2. Open treatment and reduction of partial dislocation 3. Four compartment fasciotomies 4. Large wound vac application  SURGEON:  Surgeon(s) and Role:    * Budd Palmer, MD - Primary  PHYSICIAN ASSISTANT: Montez Morita, [facility]  ANESTHESIA:   general  EBL:  Total I/O In: 2000 [I.V.:2000] Out: 700 [Urine:700]  BLOOD ADMINISTERED:none  DRAINS: none   LOCAL MEDICATIONS USED:  NONE  SPECIMEN:  No Specimen  DISPOSITION OF SPECIMEN:  N/A  COUNTS:  YES  TOURNIQUET:  * No tourniquets in log *  DICTATION: .Other Dictation: Dictation Number [id number]  PLAN OF CARE: Admit to inpatient   PATIENT DISPOSITION:  PACU - hemodynamically stable.   Delay start of Pharmacological VTE agent (>24hrs) due to surgical blood loss or risk of bleeding: no

## 2012-10-11 NOTE — Progress Notes (Signed)
According to pt's sister he has been on medications at home but they are unaware of names and doses, but she stated he has been prescribed antihypertensives as well as coumadin for the DVT/PE he has a hx of. However he has been noncompliant and does not remember the last time he has taken any of these. Pharmacy contacted who stated they will have day shift contact his pharmacy to get list of home meds.

## 2012-10-12 ENCOUNTER — Encounter (HOSPITAL_COMMUNITY): Payer: Self-pay | Admitting: Radiology

## 2012-10-12 LAB — BASIC METABOLIC PANEL
BUN: 11 mg/dL (ref 6–23)
CO2: 19 mEq/L (ref 19–32)
Glucose, Bld: 90 mg/dL (ref 70–99)
Potassium: 4.4 mEq/L (ref 3.5–5.1)
Sodium: 134 mEq/L — ABNORMAL LOW (ref 135–145)

## 2012-10-12 LAB — CBC
MCH: 31.7 pg (ref 26.0–34.0)
MCHC: 33.8 g/dL (ref 30.0–36.0)
Platelets: 152 10*3/uL (ref 150–400)
RBC: 3.56 MIL/uL — ABNORMAL LOW (ref 4.22–5.81)

## 2012-10-12 MED ORDER — CLONIDINE HCL 0.2 MG PO TABS
0.2000 mg | ORAL_TABLET | Freq: Two times a day (BID) | ORAL | Status: DC
  Administered 2012-10-12 – 2012-10-14 (×4): 0.2 mg via ORAL
  Filled 2012-10-12 (×5): qty 1

## 2012-10-12 MED ORDER — GABAPENTIN 600 MG PO TABS: 300.0000 mg | ORAL_TABLET | Freq: Two times a day (BID) | ORAL | Status: DC

## 2012-10-12 MED ORDER — HYDRALAZINE HCL 20 MG/ML IJ SOLN
5.0000 mg | Freq: Four times a day (QID) | INTRAMUSCULAR | Status: DC | PRN
  Filled 2012-10-12: qty 0.25

## 2012-10-12 MED ORDER — GABAPENTIN 300 MG PO CAPS
300.0000 mg | ORAL_CAPSULE | Freq: Two times a day (BID) | ORAL | Status: DC
  Administered 2012-10-12 – 2012-10-24 (×25): 300 mg via ORAL
  Filled 2012-10-12 (×26): qty 1

## 2012-10-12 NOTE — Progress Notes (Signed)
Hemovac on R side of bed drained 75cc of serosanguineous fluid. Hemovac on Lside of bed drained 50cc of serosanguinous .
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
## 2012-10-12 NOTE — Progress Notes (Signed)
POD 1 s/p ex-fix and compartment release  Pain improved and well controlled Wound vac lateral side off currently, medial still functioning appropriately BP 139/86  Pulse 69  Temp 98.7 F (37.1 C) (Oral)  Resp 16  SpO2 99% Temp:  [97.6 F (36.4 C)-99.8 F (37.7 C)] 98.7 F (37.1 C) (11/20 0400) Pulse Rate:  [36-100] 69  (11/20 0400) Resp:  [16-24] 16  (11/20 0743) BP: (139-176)/(86-124) 139/86 mmHg (11/20 0400) SpO2:  [62 %-100 %] 99 % (11/20 0743)  LLE sens no DPN,SPN, but TN intact  Motor weak flex, ext of great toes  No pain with passive stretch of great and lesser toes  Brisk CR and all toes warm   CT much improved but intra articular fragment depressed and blocking reduction of two primary fragments; syndesmosis disrupted  PLAN  Vac dressings to be restarted asap  Return to OR on Thursday, Friday or Monday depending on soft tissue swelling and further review of operative options, including manipulation of fragment through fasciotomy; do not expect much ground toward closure to be gained if earlier rather than later  Partial closure of fasciotomies as able, may require skin grafting, and will have to be staged  Myrene Galas, MD [facility] [phone] (p)

## 2012-10-12 NOTE — Progress Notes (Signed)
Orthopedic Tech Progress Note Patient Details:  Edwin Rodriguez 16-Dec-1970 [id number]  Patient ID: Edwin Rodriguez, male   DOB: 19-Nov-1971, 41 y.o.   MRN: [id number]   Edwin Rodriguez 10/12/2012, 4:33 PM Trapeze bar

## 2012-10-13 ENCOUNTER — Encounter (HOSPITAL_COMMUNITY): Payer: Self-pay | Admitting: Orthopedic Surgery

## 2012-10-13 LAB — BASIC METABOLIC PANEL
Chloride: 100 mEq/L (ref 96–112)
GFR calc Af Amer: >90 mL/min (ref >90)
Potassium: 3.7 mEq/L (ref 3.5–5.1)

## 2012-10-13 LAB — CBC
Platelets: 120 10*3/uL — ABNORMAL LOW (ref 150–400)
RDW: 12.7 % (ref 11.5–15.5)
WBC: 4.8 10*3/uL (ref 4.0–10.5)

## 2012-10-13 MED ORDER — CEFAZOLIN SODIUM-DEXTROSE 2-3 GM-% IV SOLR
2.0000 g | Freq: Once | INTRAVENOUS | Status: AC
  Administered 2012-10-14: 2 g via INTRAVENOUS
  Filled 2012-10-13: qty 50
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
## 2012-10-13 NOTE — Progress Notes (Signed)
Physical Therapy Treatment Patient Details Name: Edwin Rodriguez MRN: [id number] DOB: [birth year] Today's Date: 10/13/2012 Time: 4098-1191 PT Time Calculation (min): 23 min  PT Assessment / Plan / Recommendation Comments on Treatment Session  pt progressing , to return to OR tomorrow    Follow Up Recommendations  Home health PT     Does the patient have the potential to tolerate intense rehabilitation     Barriers to Discharge        Equipment Recommendations  Rolling walker with 5" wheels    Recommendations for Other Services    Frequency Min 6X/week   Plan Discharge plan remains appropriate;Frequency remains appropriate    Precautions / Restrictions Precautions Precautions: Fall Restrictions LLE Weight Bearing: Non weight bearing   Pertinent Vitals/Pain O2 sats 96% on RA, O2 replaced upon departure    Mobility  Bed Mobility Bed Mobility: Supine to Sit Supine to Sit: 4: Min assist Details for Bed Mobility Assistance: min assist for managing L LE Transfers Transfers: Sit to Stand;Stand to Sit Sit to Stand: 4: Min guard;5: Supervision;From chair/3-in-1;From bed;With upper extremity assist Stand to Sit: 4: Min guard;To chair/3-in-1 Details for Transfer Assistance: cues for use of UEs and min assist and cues for management of L LE Ambulation/Gait Ambulation/Gait Assistance: 4: Min guard;5: Supervision Ambulation Distance (Feet): 12 Feet (8) Assistive device: Rolling walker Ambulation/Gait Assistance Details: min cues for posture and position from RW,. Pt performs NWB on L with min difficulty but requires assist to manage equipment including 2 wound vacs Gait Pattern: Step-to pattern Gait velocity: decreased    Exercises     PT Diagnosis:    PT Problem List:   PT Treatment Interventions:     PT Goals Acute Rehab PT Goals Time For Goal Achievement: 10/19/12 Potential to Achieve Goals: Good Pt will go Supine/Side to Sit: with modified independence PT Goal:  Supine/Side to Sit - Progress: Progressing toward goal Pt will go Sit to Stand: with modified independence PT Goal: Sit to Stand - Progress: Progressing toward goal Pt will go Stand to Sit: with modified independence PT Goal: Stand to Sit - Progress: Progressing toward goal Pt will Ambulate: 51 - 150 feet;with supervision;with rolling walker PT Goal: Ambulate - Progress: Progressing toward goal  Visit Information  Last PT Received On: 10/13/12 Assistance Needed: +2 (lines) PT/OT Co-Evaluation/Treatment: Yes    Subjective Data  Subjective: It is my leg, throbbing Patient Stated Goal: D/C home before his birthday   Cognition  Overall Cognitive Status: Appears within functional limits for tasks assessed/performed Arousal/Alertness: Awake/alert Orientation Level: Appears intact for tasks assessed Behavior During Session: [facility] for tasks performed    Balance     End of Session PT - End of Session Activity Tolerance: Patient tolerated treatment well Patient left: in chair;with call bell/phone within reach   GP     [facility] : [facility] 10/13/2012, 9:34 AM
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
## 2012-10-13 NOTE — Progress Notes (Signed)
  Echocardiogram 2D Echocardiogram has been performed.  Kimberly Nieland FRANCES 10/13/2012, 3:54 PM

## 2012-10-14 ENCOUNTER — Inpatient Hospital Stay (HOSPITAL_COMMUNITY): Payer: Medicaid Other

## 2012-10-14 ENCOUNTER — Encounter (HOSPITAL_COMMUNITY): Payer: Self-pay | Admitting: Anesthesiology

## 2012-10-14 ENCOUNTER — Inpatient Hospital Stay (HOSPITAL_COMMUNITY): Payer: Medicaid Other | Admitting: Anesthesiology

## 2012-10-14 ENCOUNTER — Encounter (HOSPITAL_COMMUNITY)
Admission: EM | Disposition: A | Discharge status: Home Health | Payer: Self-pay | Source: Home / Self Care | Attending: Orthopedic Surgery

## 2012-10-14 HISTORY — PX: I & D EXTREMITY: SHX5045

## 2012-10-14 LAB — CBC
HCT: 29.6 % — ABNORMAL LOW (ref 39.0–52.0)
Hemoglobin: 10.3 g/dL — ABNORMAL LOW (ref 13.0–17.0)
RBC: 3.2 MIL/uL — ABNORMAL LOW (ref 4.22–5.81)
WBC: 5.9 10*3/uL (ref 4.0–10.5)

## 2012-10-14 LAB — BASIC METABOLIC PANEL
BUN: 10 mg/dL (ref 6–23)
Chloride: 97 mEq/L (ref 96–112)
GFR calc Af Amer: >90 mL/min (ref >90)
Glucose, Bld: 105 mg/dL — ABNORMAL HIGH (ref 70–99)
Potassium: 4 mEq/L (ref 3.5–5.1)

## 2012-10-14 SURGERY — IRRIGATION AND DEBRIDEMENT EXTREMITY
Anesthesia: General | Site: Leg Lower | Laterality: Left | Wound class: Clean

## 2012-10-14 MED ORDER — CLONIDINE HCL 0.3 MG PO TABS
0.3000 mg | ORAL_TABLET | Freq: Two times a day (BID) | ORAL | Status: DC
  Administered 2012-10-14 – 2012-10-24 (×20): 0.3 mg via ORAL
  Filled 2012-10-14 (×21): qty 1

## 2012-10-14 MED ORDER — ONDANSETRON HCL 4 MG/2ML IJ SOLN: 4.0000 mg | Freq: Once | INTRAMUSCULAR | Status: DC | PRN

## 2012-10-14 MED ORDER — LIDOCAINE HCL (CARDIAC) 20 MG/ML IV SOLN
INTRAVENOUS | Status: DC | PRN
  Administered 2012-10-14: 40 mg via INTRAVENOUS

## 2012-10-14 MED ORDER — ACETAMINOPHEN 10 MG/ML IV SOLN
1000.0000 mg | Freq: Once | INTRAVENOUS | Status: AC | PRN
  Administered 2012-10-14: 1000 mg via INTRAVENOUS
  Filled 2012-10-14: qty 100

## 2012-10-14 MED ORDER — FENTANYL CITRATE 0.05 MG/ML IJ SOLN
INTRAMUSCULAR | Status: DC | PRN
  Administered 2012-10-14: 200 ug via INTRAVENOUS
  Administered 2012-10-14: 50 ug via INTRAVENOUS
  Administered 2012-10-14: 100 ug via INTRAVENOUS

## 2012-10-14 MED ORDER — PROPOFOL 10 MG/ML IV BOLUS
INTRAVENOUS | Status: DC | PRN
  Administered 2012-10-14: 200 mg via INTRAVENOUS

## 2012-10-14 MED ORDER — ROCURONIUM BROMIDE 100 MG/10ML IV SOLN
INTRAVENOUS | Status: DC | PRN
  Administered 2012-10-14: 50 mg via INTRAVENOUS

## 2012-10-14 MED ORDER — MIDAZOLAM HCL 5 MG/5ML IJ SOLN
INTRAMUSCULAR | Status: DC | PRN
  Administered 2012-10-14: 2 mg via INTRAVENOUS

## 2012-10-14 MED ORDER — HYDROMORPHONE HCL PF 1 MG/ML IJ SOLN
0.2500 mg | INTRAMUSCULAR | Status: DC | PRN
  Administered 2012-10-14 (×2): 0.5 mg via INTRAVENOUS

## 2012-10-14 MED ORDER — GLYCOPYRROLATE 0.2 MG/ML IJ SOLN
INTRAMUSCULAR | Status: DC | PRN
  Administered 2012-10-14: .5 mg via INTRAVENOUS

## 2012-10-14 MED ORDER — 0.9 % SODIUM CHLORIDE (POUR BTL) OPTIME
TOPICAL | Status: DC | PRN
  Administered 2012-10-14: 1000 mL

## 2012-10-14 MED ORDER — METHOCARBAMOL 100 MG/ML IJ SOLN
500.0000 mg | INTRAVENOUS | Status: AC
  Administered 2012-10-14: 1000 mg via INTRAVENOUS
  Filled 2012-10-14: qty 10

## 2012-10-14 MED ORDER — NEOSTIGMINE METHYLSULFATE 1 MG/ML IJ SOLN
INTRAMUSCULAR | Status: DC | PRN
  Administered 2012-10-14: 3 mg via INTRAVENOUS

## 2012-10-14 MED ORDER — LACTATED RINGERS IV SOLN
INTRAVENOUS | Status: DC | PRN
  Administered 2012-10-14 (×2): via INTRAVENOUS

## 2012-10-14 SURGICAL SUPPLY — 69 items
BANDAGE ELASTIC 3 VELCRO ST LF (GAUZE/BANDAGES/DRESSINGS) ×3 IMPLANT
BANDAGE ELASTIC 4 VELCRO ST LF (GAUZE/BANDAGES/DRESSINGS) ×3 IMPLANT
BANDAGE ELASTIC 6 VELCRO ST LF (GAUZE/BANDAGES/DRESSINGS) ×3 IMPLANT
BANDAGE ESMARK 6X9 LF (GAUZE/BANDAGES/DRESSINGS) ×2 IMPLANT
BANDAGE GAUZE ELAST BULKY 4 IN (GAUZE/BANDAGES/DRESSINGS) ×3 IMPLANT
BNDG COHESIVE 6X5 TAN STRL LF (GAUZE/BANDAGES/DRESSINGS) IMPLANT
BNDG ESMARK 6X9 LF (GAUZE/BANDAGES/DRESSINGS) ×3
BRUSH SCRUB DISP (MISCELLANEOUS) ×6 IMPLANT
CANISTER WOUND CARE 500ML ATS (WOUND CARE) ×6 IMPLANT
CLEANER TIP ELECTROSURG 2X2 (MISCELLANEOUS) IMPLANT
CLOTH BEACON ORANGE TIMEOUT ST (SAFETY) ×3 IMPLANT
COVER MAYO STAND STRL (DRAPES) IMPLANT
COVER SURGICAL LIGHT HANDLE (MISCELLANEOUS) ×6 IMPLANT
CUFF TOURNIQUET SINGLE 18IN (TOURNIQUET CUFF) IMPLANT
CUFF TOURNIQUET SINGLE 24IN (TOURNIQUET CUFF) IMPLANT
DECANTER SPIKE VIAL GLASS SM (MISCELLANEOUS) IMPLANT
DRAPE C-ARM 42X72 X-RAY (DRAPES) ×3 IMPLANT
DRAPE C-ARMOR (DRAPES) ×3 IMPLANT
DRAPE OEC MINIVIEW 54X84 (DRAPES) IMPLANT
DRAPE ORTHO SPLIT 77X108 STRL (DRAPES) ×1
DRAPE SURG ORHT 6 SPLT 77X108 (DRAPES) ×2 IMPLANT
DRAPE U-SHAPE 47X51 STRL (DRAPES) ×3 IMPLANT
DRSG ADAPTIC 3X8 NADH LF (GAUZE/BANDAGES/DRESSINGS) ×3 IMPLANT
DRSG MEPITEL 4X7.2 (GAUZE/BANDAGES/DRESSINGS) ×6 IMPLANT
DRSG VAC ATS LRG SENSATRAC (GAUZE/BANDAGES/DRESSINGS) ×3 IMPLANT
DRSG VAC ATS MED SENSATRAC (GAUZE/BANDAGES/DRESSINGS) ×3 IMPLANT
ELECT REM PT RETURN 9FT ADLT (ELECTROSURGICAL) ×3
ELECTRODE REM PT RTRN 9FT ADLT (ELECTROSURGICAL) ×2 IMPLANT
EVACUATOR 1/8 PVC DRAIN (DRAIN) IMPLANT
GLOVE BIO SURGEON STRL SZ7.5 (GLOVE) ×3 IMPLANT
GLOVE BIO SURGEON STRL SZ8 (GLOVE) ×3 IMPLANT
GLOVE BIOGEL PI IND STRL 7.5 (GLOVE) ×2 IMPLANT
GLOVE BIOGEL PI IND STRL 8 (GLOVE) ×2 IMPLANT
GLOVE BIOGEL PI INDICATOR 7.5 (GLOVE) ×1
GLOVE BIOGEL PI INDICATOR 8 (GLOVE) ×1
GOWN PREVENTION PLUS XLARGE (GOWN DISPOSABLE) ×3 IMPLANT
GOWN PREVENTION PLUS XXLARGE (GOWN DISPOSABLE) ×3 IMPLANT
GOWN STRL NON-REIN LRG LVL3 (GOWN DISPOSABLE) ×6 IMPLANT
KIT BASIN OR (CUSTOM PROCEDURE TRAY) ×3 IMPLANT
KIT ROOM TURNOVER OR (KITS) ×3 IMPLANT
MANIFOLD NEPTUNE II (INSTRUMENTS) ×3 IMPLANT
NEEDLE 22X1 1/2 (OR ONLY) (NEEDLE) IMPLANT
NS IRRIG 1000ML POUR BTL (IV SOLUTION) ×3 IMPLANT
PACK ORTHO EXTREMITY (CUSTOM PROCEDURE TRAY) ×3 IMPLANT
PAD ARMBOARD 7.5X6 YLW CONV (MISCELLANEOUS) ×6 IMPLANT
PADDING CAST COTTON 6X4 STRL (CAST SUPPLIES) ×9 IMPLANT
SPONGE GAUZE 4X4 12PLY (GAUZE/BANDAGES/DRESSINGS) ×3 IMPLANT
SPONGE LAP 18X18 X RAY DECT (DISPOSABLE) ×3 IMPLANT
SPONGE SCRUB IODOPHOR (GAUZE/BANDAGES/DRESSINGS) ×3 IMPLANT
STAPLER VISISTAT 35W (STAPLE) IMPLANT
STOCKINETTE IMPERVIOUS LG (DRAPES) ×3 IMPLANT
STRIP CLOSURE SKIN 1/2X4 (GAUZE/BANDAGES/DRESSINGS) IMPLANT
SUCTION FRAZIER TIP 10 FR DISP (SUCTIONS) IMPLANT
SUT ETHILON 3 0 PS 1 (SUTURE) IMPLANT
SUT PDS AB 0 CT 36 (SUTURE) ×3 IMPLANT
SUT PDS AB 2-0 CT1 27 (SUTURE) IMPLANT
SUT PROLENE 0 CT 1 30 (SUTURE) ×6 IMPLANT
SUT VIC AB 0 CT1 27 (SUTURE) ×2
SUT VIC AB 0 CT1 27XBRD ANBCTR (SUTURE) ×4 IMPLANT
SUT VIC AB 2-0 CT1 27 (SUTURE) ×2
SUT VIC AB 2-0 CT1 TAPERPNT 27 (SUTURE) ×4 IMPLANT
SYR CONTROL 10ML LL (SYRINGE) IMPLANT
TOWEL OR 17X24 6PK STRL BLUE (TOWEL DISPOSABLE) ×6 IMPLANT
TOWEL OR 17X26 10 PK STRL BLUE (TOWEL DISPOSABLE) ×6 IMPLANT
TRAY FOLEY CATH 14FR (SET/KITS/TRAYS/PACK) IMPLANT
TUBE CONNECTING 12X1/4 (SUCTIONS) ×3 IMPLANT
UNDERPAD 30X30 INCONTINENT (UNDERPADS AND DIAPERS) ×3 IMPLANT
WATER STERILE IRR 1000ML POUR (IV SOLUTION) ×6 IMPLANT
YANKAUER SUCT BULB TIP NO VENT (SUCTIONS) ×3 IMPLANT

## 2012-10-14 NOTE — Brief Op Note (Signed)
10/10/2012 - 10/14/2012  9:29 AM  PATIENT:  Edwin Rodriguez  41 y.o. male  PRE-OPERATIVE DIAGNOSIS:  left pilon fracture, compartment syndrome  POST-OPERATIVE DIAGNOSIS:  left pilon fracture, compartment syndrome  PROCEDURE:  Procedure(s) (LRB) with comments: IRRIGATION AND DEBRIDEMENT EXTREMITY (Left) - with vac change   SURGEON:  Surgeon(s) and Role:    * Budd Palmer, MD - Primary  PHYSICIAN ASSISTANT: Montez Morita, [facility]  ANESTHESIA:   general  EBL:     BLOOD ADMINISTERED:none  DRAINS: large wound vac medially and laterally   LOCAL MEDICATIONS USED:  NONE  SPECIMEN:  No Specimen  DISPOSITION OF SPECIMEN:  N/A  COUNTS:  YES  TOURNIQUET:  * No tourniquets in log *  DICTATION: .Other Dictation: Dictation Number [id number]  PLAN OF CARE: Admit to inpatient   PATIENT DISPOSITION:  PACU - hemodynamically stable.   Delay start of Pharmacological VTE agent (>24hrs) due to surgical blood loss or risk of bleeding: no

## 2012-10-14 NOTE — Anesthesia Preprocedure Evaluation (Signed)
Anesthesia Evaluation  Patient identified by MRN, date of birth, ID band Patient awake    Reviewed: Allergy & Precautions, H&P , NPO status , Patient's Chart, lab work & pertinent test results, reviewed documented beta blocker date and time   Airway Mallampati: II      Dental  (+) Teeth Intact and Dental Advisory Given   Pulmonary  breath sounds clear to auscultation        Cardiovascular Rhythm:Regular Rate:Normal     Neuro/Psych    GI/Hepatic   Endo/Other    Renal/GU      Musculoskeletal   Abdominal   Peds  Hematology   Anesthesia Other Findings   Reproductive/Obstetrics                           Anesthesia Physical Anesthesia Plan  ASA: III  Anesthesia Plan: General   Post-op Pain Management:    Induction: Intravenous  Airway Management Planned: Oral ETT  Additional Equipment:   Intra-op Plan:   Post-operative Plan: Extubation in OR  Informed Consent:   Plan Discussed with: CRNA and Surgeon  Anesthesia Plan Comments:         Anesthesia Quick Evaluation

## 2012-10-14 NOTE — Progress Notes (Signed)
PT Cancellation Note  Patient Details Name: Tim Corriher MRN: [id number] DOB: 1971-10-24   Cancelled Treatment:    Reason Eval/Treat Not Completed: Patient not medically ready;Other (comment) (pt. currently in OR)  Please reorder PT when appropriate.   Ferman Hamming 10/14/2012, 8:37 AM Weldon Picking PT Acute Rehab Services [phone] Beeper [phone]

## 2012-10-14 NOTE — Preoperative (Signed)
Beta Blockers   Reason not to administer Beta Blockers:Not Applicable 

## 2012-10-14 NOTE — Progress Notes (Addendum)
Patient down in in the operating room today for irrigation and debridement of his left extremity. Review of his echo notes cardiomegaly and left ventricular hypertrophy, but fortunately no signs of any congestive heart failure or cardiomyopathy. Pressures are starting to trend back up, likely given to decreasing pain medications. Have titrated back up his clonidine. Overall plans will be to continue to monitor blood pressure and adjust medications accordingly. Fortunately did not have CHF to worry about.

## 2012-10-14 NOTE — Transfer of Care (Signed)
Immediate Anesthesia Transfer of Care Note  Patient: Edwin Rodriguez  Procedure(s) Performed: Procedure(s) (LRB) with comments: IRRIGATION AND DEBRIDEMENT EXTREMITY (Left) - with vac change   Patient Location: PACU  Anesthesia Type:General  Level of Consciousness: awake, alert  and oriented  Airway & Oxygen Therapy: Patient Spontanous Breathing and Patient connected to nasal cannula oxygen  Post-op Assessment: Report given to PACU RN and Post -op Vital signs reviewed and stable  Post vital signs: Reviewed and stable  Complications: No apparent anesthesia complications

## 2012-10-14 NOTE — Anesthesia Postprocedure Evaluation (Signed)
  Anesthesia Post-op Note  Patient: Edwin Rodriguez  Procedure(s) Performed: Procedure(s) (LRB) with comments: IRRIGATION AND DEBRIDEMENT EXTREMITY (Left) - with vac change   Patient Location: PACU  Anesthesia Type:General  Level of Consciousness: awake, alert  and oriented  Airway and Oxygen Therapy: Patient Spontanous Breathing and Patient connected to nasal cannula oxygen  Post-op Pain: mild  Post-op Assessment: Post-op Vital signs reviewed and Patient's Cardiovascular Status Stable  Post-op Vital Signs: stable  Complications: No apparent anesthesia complications

## 2012-10-14 NOTE — Progress Notes (Signed)
Utilization review completed. Imojean Yoshino, RN, BSN. 

## 2012-10-14 NOTE — Progress Notes (Signed)
I saw and examined the patient with Mr. Paul, communicating the findings and plan noted above.  Lourdes Manning, MD [facility] [phone] (p)  

## 2012-10-14 NOTE — Progress Notes (Signed)
Left shoulder x-ray done as ordered

## 2012-10-15 LAB — BASIC METABOLIC PANEL
CO2: 30 mEq/L (ref 19–32)
Calcium: 9.2 mg/dL (ref 8.4–10.5)
Chloride: 99 mEq/L (ref 96–112)
Sodium: 135 mEq/L (ref 135–145)

## 2012-10-15 LAB — CBC
Platelets: 177 10*3/uL (ref 150–400)
RBC: 3.35 MIL/uL — ABNORMAL LOW (ref 4.22–5.81)
WBC: 5.7 10*3/uL (ref 4.0–10.5)

## 2012-10-15 NOTE — Progress Notes (Signed)
Physical Therapy Treatment Patient Details Name: Edwin Rodriguez MRN: [id number] DOB: 05-Jun-1971 Today's Date: 10/15/2012 Time: 4098-1191 PT Time Calculation (min): 13 min  PT Assessment / Plan / Recommendation Comments on Treatment Session  pt progressing well, pain con't to limit ambulation tolerance. Pt scheduled for OR monday    Follow Up Recommendations        Does the patient have the potential to tolerate intense rehabilitation     Barriers to Discharge        Equipment Recommendations  Rolling walker with 5" wheels    Recommendations for Other Services    Frequency Min 6X/week   Plan Discharge plan remains appropriate;Frequency remains appropriate    Precautions / Restrictions Precautions Precautions: Fall Restrictions LLE Weight Bearing: Non weight bearing   Pertinent Vitals/Pain 7/10 L LE pain    Mobility  Bed Mobility Bed Mobility: Supine to Sit Supine to Sit: 5: Supervision;HOB elevated Details for Bed Mobility Assistance: I management of L LE Transfers Transfers: Sit to Stand;Stand to Sit Sit to Stand: 4: Min guard;With upper extremity assist;From bed Stand to Sit: 4: Min guard;With upper extremity assist;To chair/3-in-1 Ambulation/Gait Ambulation/Gait Assistance: 4: Min guard Ambulation Distance (Feet): 60 Feet Assistive device: Rolling walker Ambulation/Gait Assistance Details: v/c's for L LE positioning to assist in pain management, v/c's for walker management. 2 standing rest breaks Gait Pattern: Step-to pattern Gait velocity: slow Stairs: No    Exercises     PT Diagnosis:    PT Problem List:   PT Treatment Interventions:     PT Goals Acute Rehab PT Goals PT Goal: Supine/Side to Sit - Progress: Progressing toward goal PT Goal: Sit to Supine/Side - Progress: Progressing toward goal PT Goal: Sit to Stand - Progress: Progressing toward goal PT Goal: Stand to Sit - Progress: Progressing toward goal PT Goal: Ambulate - Progress: Progressing  toward goal  Visit Information  Last PT Received On: 10/15/12 Assistance Needed: +1 (2nd person nice for 2 wound vacs/iv line)    Subjective Data  Subjective: Pt agreeable to ambulate post encouragement Patient Stated Goal: d/c home   Cognition  Overall Cognitive Status: Appears within functional limits for tasks assessed/performed Arousal/Alertness: Awake/alert Orientation Level: Appears intact for tasks assessed Behavior During Session: [facility] for tasks performed    Balance     End of Session PT - End of Session Equipment Utilized During Treatment: Gait belt Activity Tolerance: Patient tolerated treatment well Patient left: in chair;with call bell/phone within reach Nurse Communication: Mobility status   GP     Marcene Brawn 10/15/2012, 5:03 PM  Lewis Shock, PT, DPT Pager #: [phone] Office #: [phone]
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
## 2012-10-16 MED ORDER — WHITE PETROLATUM GEL
Status: AC
  Administered 2012-10-16: 1
  Filled 2012-10-16: qty 5
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
## 2012-10-17 ENCOUNTER — Inpatient Hospital Stay (HOSPITAL_COMMUNITY): Payer: Medicaid Other | Admitting: Anesthesiology

## 2012-10-17 ENCOUNTER — Encounter (HOSPITAL_COMMUNITY): Payer: Self-pay | Admitting: Anesthesiology

## 2012-10-17 ENCOUNTER — Encounter (HOSPITAL_COMMUNITY)
Admission: EM | Disposition: A | Discharge status: Home Health | Payer: Self-pay | Source: Home / Self Care | Attending: Orthopedic Surgery

## 2012-10-17 DIAGNOSIS — S4350XA Sprain of unspecified acromioclavicular joint, initial encounter: Secondary | ICD-10-CM | POA: Diagnosis present

## 2012-10-17 HISTORY — PX: APPLICATION OF WOUND VAC: SHX5189

## 2012-10-17 HISTORY — PX: SKIN SPLIT GRAFT: SHX444

## 2012-10-17 LAB — COMPREHENSIVE METABOLIC PANEL
ALT: 25 U/L (ref 0–53)
AST: 29 U/L (ref 0–37)
CO2: 31 mEq/L (ref 19–32)
Chloride: 95 mEq/L — ABNORMAL LOW (ref 96–112)
Creatinine, Ser: 0.94 mg/dL (ref 0.50–1.35)
GFR calc non Af Amer: >90 mL/min (ref >90)
Total Bilirubin: 0.5 mg/dL (ref 0.3–1.2)

## 2012-10-17 LAB — CBC
HCT: 31.2 % — ABNORMAL LOW (ref 39.0–52.0)
MCV: 89.9 fL (ref 78.0–100.0)
Platelets: 243 10*3/uL (ref 150–400)
RBC: 3.47 MIL/uL — ABNORMAL LOW (ref 4.22–5.81)
WBC: 8 10*3/uL (ref 4.0–10.5)

## 2012-10-17 SURGERY — APPLICATION, GRAFT, SKIN, SPLIT-THICKNESS
Anesthesia: General | Site: Leg Lower | Laterality: Left | Wound class: Contaminated

## 2012-10-17 MED ORDER — SUCCINYLCHOLINE CHLORIDE 20 MG/ML IJ SOLN
INTRAMUSCULAR | Status: DC | PRN
  Administered 2012-10-17: 100 mg via INTRAVENOUS

## 2012-10-17 MED ORDER — LIDOCAINE HCL (CARDIAC) 20 MG/ML IV SOLN
INTRAVENOUS | Status: DC | PRN
  Administered 2012-10-17: 80 mg via INTRAVENOUS

## 2012-10-17 MED ORDER — BUPIVACAINE-EPINEPHRINE 0.5% -1:200000 IJ SOLN
INTRAMUSCULAR | Status: DC | PRN
  Administered 2012-10-17: 30 mL

## 2012-10-17 MED ORDER — OXYCODONE-ACETAMINOPHEN 5-325 MG PO TABS
1.0000 | ORAL_TABLET | Freq: Four times a day (QID) | ORAL | Status: DC | PRN
  Administered 2012-10-17 – 2012-10-24 (×7): 2 via ORAL
  Filled 2012-10-17 (×7): qty 2

## 2012-10-17 MED ORDER — WARFARIN SODIUM 10 MG PO TABS
10.0000 mg | ORAL_TABLET | Freq: Once | ORAL | Status: AC
  Administered 2012-10-17: 10 mg via ORAL
  Filled 2012-10-17: qty 1

## 2012-10-17 MED ORDER — MIDAZOLAM HCL 5 MG/5ML IJ SOLN
INTRAMUSCULAR | Status: DC | PRN
  Administered 2012-10-17: 2 mg via INTRAVENOUS

## 2012-10-17 MED ORDER — LACTATED RINGERS IV SOLN
INTRAVENOUS | Status: DC | PRN
  Administered 2012-10-17: 13:00:00 via INTRAVENOUS

## 2012-10-17 MED ORDER — PHENYLEPHRINE HCL 10 MG/ML IJ SOLN
INTRAMUSCULAR | Status: DC | PRN
  Administered 2012-10-17 (×2): 40 ug via INTRAVENOUS
  Administered 2012-10-17: 80 ug via INTRAVENOUS

## 2012-10-17 MED ORDER — SODIUM CHLORIDE 0.9 % IV SOLN
INTRAVENOUS | Status: DC | PRN
  Administered 2012-10-17: 15:00:00 via INTRAVENOUS

## 2012-10-17 MED ORDER — SODIUM CHLORIDE 0.9 % IJ SOLN: 9.0000 mL | INTRAMUSCULAR | Status: DC | PRN

## 2012-10-17 MED ORDER — WARFARIN - PHARMACIST DOSING INPATIENT: Freq: Every day | Status: DC

## 2012-10-17 MED ORDER — PROPOFOL 10 MG/ML IV BOLUS
INTRAVENOUS | Status: DC | PRN
  Administered 2012-10-17: 300 mg via INTRAVENOUS

## 2012-10-17 MED ORDER — MINERAL OIL LIGHT 100 % EX OIL
TOPICAL_OIL | CUTANEOUS | Status: AC
  Filled 2012-10-17: qty 25

## 2012-10-17 MED ORDER — HYDROMORPHONE HCL PF 1 MG/ML IJ SOLN
INTRAMUSCULAR | Status: AC
  Filled 2012-10-17: qty 1

## 2012-10-17 MED ORDER — FENTANYL CITRATE 0.05 MG/ML IJ SOLN
INTRAMUSCULAR | Status: DC | PRN
  Administered 2012-10-17 (×3): 50 ug via INTRAVENOUS
  Administered 2012-10-17 (×2): 100 ug via INTRAVENOUS

## 2012-10-17 MED ORDER — OXYCODONE HCL 5 MG PO TABS: 5.0000 mg | ORAL_TABLET | Freq: Once | ORAL | Status: AC | PRN

## 2012-10-17 MED ORDER — HYDROMORPHONE 0.3 MG/ML IV SOLN
INTRAVENOUS | Status: DC
  Administered 2012-10-17: 18:00:00 via INTRAVENOUS
  Administered 2012-10-18: 4.5 mg via INTRAVENOUS
  Filled 2012-10-17 (×2): qty 25

## 2012-10-17 MED ORDER — OXYCODONE HCL 5 MG/5ML PO SOLN: 5.0000 mg | Freq: Once | ORAL | Status: AC | PRN

## 2012-10-17 MED ORDER — WARFARIN VIDEO: Freq: Once | Status: DC

## 2012-10-17 MED ORDER — ROCURONIUM BROMIDE 100 MG/10ML IV SOLN
INTRAVENOUS | Status: DC | PRN
  Administered 2012-10-17: 30 mg via INTRAVENOUS

## 2012-10-17 MED ORDER — COUMADIN BOOK
Freq: Once | Status: DC
  Filled 2012-10-17: qty 1

## 2012-10-17 MED ORDER — HYDROMORPHONE HCL PF 1 MG/ML IJ SOLN
0.2500 mg | INTRAMUSCULAR | Status: DC | PRN
  Administered 2012-10-17 (×2): 0.5 mg via INTRAVENOUS

## 2012-10-17 MED ORDER — HYDROMORPHONE 0.3 MG/ML IV SOLN
INTRAVENOUS | Status: AC
  Filled 2012-10-17: qty 25

## 2012-10-17 MED ORDER — CEFAZOLIN SODIUM 1-5 GM-% IV SOLN
INTRAVENOUS | Status: AC
  Filled 2012-10-17: qty 50

## 2012-10-17 MED ORDER — DIPHENHYDRAMINE HCL 12.5 MG/5ML PO ELIX: 12.5000 mg | ORAL_SOLUTION | Freq: Four times a day (QID) | ORAL | Status: DC | PRN

## 2012-10-17 MED ORDER — DIPHENHYDRAMINE HCL 50 MG/ML IJ SOLN: 12.5000 mg | Freq: Four times a day (QID) | INTRAMUSCULAR | Status: DC | PRN

## 2012-10-17 MED ORDER — ARTIFICIAL TEARS OP OINT
TOPICAL_OINTMENT | OPHTHALMIC | Status: DC | PRN
  Administered 2012-10-17: 1 via OPHTHALMIC

## 2012-10-17 MED ORDER — ONDANSETRON HCL 4 MG/2ML IJ SOLN
INTRAMUSCULAR | Status: DC | PRN
  Administered 2012-10-17: 4 mg via INTRAVENOUS

## 2012-10-17 MED ORDER — MINERAL OIL LIGHT 100 % EX OIL
TOPICAL_OIL | CUTANEOUS | Status: DC | PRN
  Administered 2012-10-17: 1 via TOPICAL

## 2012-10-17 MED ORDER — PROMETHAZINE HCL 25 MG/ML IJ SOLN: 6.2500 mg | INTRAMUSCULAR | Status: DC | PRN

## 2012-10-17 MED ORDER — ONDANSETRON HCL 4 MG/2ML IJ SOLN: 4.0000 mg | Freq: Four times a day (QID) | INTRAMUSCULAR | Status: DC | PRN

## 2012-10-17 MED ORDER — SODIUM CHLORIDE 0.9 % IR SOLN
Status: DC | PRN
  Administered 2012-10-17: 3000 mL
  Administered 2012-10-17: 1000 mL

## 2012-10-17 MED ORDER — NALOXONE HCL 0.4 MG/ML IJ SOLN: 0.4000 mg | INTRAMUSCULAR | Status: DC | PRN

## 2012-10-17 MED ORDER — BUPIVACAINE-EPINEPHRINE (PF) 0.5% -1:200000 IJ SOLN
INTRAMUSCULAR | Status: AC
  Filled 2012-10-17: qty 10

## 2012-10-17 MED FILL — Hydromorphone HCl Inj 1 MG/ML: INTRAVENOUS | Qty: 25 | Status: AC

## 2012-10-17 SURGICAL SUPPLY — 93 items
BANDAGE ELASTIC 3 VELCRO ST LF (GAUZE/BANDAGES/DRESSINGS) ×3 IMPLANT
BANDAGE ELASTIC 4 VELCRO ST LF (GAUZE/BANDAGES/DRESSINGS) ×3 IMPLANT
BANDAGE ELASTIC 6 VELCRO ST LF (GAUZE/BANDAGES/DRESSINGS) IMPLANT
BANDAGE ESMARK 6X9 LF (GAUZE/BANDAGES/DRESSINGS) IMPLANT
BANDAGE GAUZE ELAST BULKY 4 IN (GAUZE/BANDAGES/DRESSINGS) ×9 IMPLANT
BLADE DERMATOME II (BLADE) ×3 IMPLANT
BLADE DERMATOME SS (BLADE) ×3 IMPLANT
BLADE SURG ROTATE 9660 (MISCELLANEOUS) ×3 IMPLANT
BNDG COHESIVE 4X5 TAN STRL (GAUZE/BANDAGES/DRESSINGS) IMPLANT
BNDG COHESIVE 6X5 TAN STRL LF (GAUZE/BANDAGES/DRESSINGS) IMPLANT
BNDG ESMARK 6X9 LF (GAUZE/BANDAGES/DRESSINGS)
BRUSH SCRUB DISP (MISCELLANEOUS) ×9 IMPLANT
CANISTER SUCTION 2500CC (MISCELLANEOUS) IMPLANT
CLEANER TIP ELECTROSURG 2X2 (MISCELLANEOUS) ×3 IMPLANT
CLOTH BEACON ORANGE TIMEOUT ST (SAFETY) ×3 IMPLANT
COTTONBALL LRG STERILE PKG (GAUZE/BANDAGES/DRESSINGS) IMPLANT
COVER MAYO STAND STRL (DRAPES) ×3 IMPLANT
COVER SURGICAL LIGHT HANDLE (MISCELLANEOUS) ×3 IMPLANT
CUFF TOURNIQUET SINGLE 18IN (TOURNIQUET CUFF) IMPLANT
CUFF TOURNIQUET SINGLE 24IN (TOURNIQUET CUFF) IMPLANT
DECANTER SPIKE VIAL GLASS SM (MISCELLANEOUS) IMPLANT
DEPRESSOR TONGUE BLADE STERILE (MISCELLANEOUS) ×6 IMPLANT
DERMACARRIERS GRAFT 1 TO 1.5 (DISPOSABLE) ×3
DRAPE C-ARM 42X72 X-RAY (DRAPES) ×3 IMPLANT
DRAPE C-ARMOR (DRAPES) ×3 IMPLANT
DRAPE EXTREMITY T 121X128X90 (DRAPE) IMPLANT
DRAPE OEC MINIVIEW 54X84 (DRAPES) IMPLANT
DRAPE ORTHO SPLIT 77X108 STRL (DRAPES) ×2
DRAPE PROXIMA HALF (DRAPES) ×6 IMPLANT
DRAPE SURG ORHT 6 SPLT 77X108 (DRAPES) ×4 IMPLANT
DRAPE U-SHAPE 47X51 STRL (DRAPES) ×3 IMPLANT
DRSG ADAPTIC 3X8 NADH LF (GAUZE/BANDAGES/DRESSINGS) ×3 IMPLANT
DRSG MEPITEL 4X7.2 (GAUZE/BANDAGES/DRESSINGS) ×3 IMPLANT
DRSG PAD ABDOMINAL 8X10 ST (GAUZE/BANDAGES/DRESSINGS) ×3 IMPLANT
DRSG VAC ATS LRG SENSATRAC (GAUZE/BANDAGES/DRESSINGS) ×3 IMPLANT
ELECT REM PT RETURN 9FT ADLT (ELECTROSURGICAL) ×3
ELECTRODE REM PT RTRN 9FT ADLT (ELECTROSURGICAL) ×2 IMPLANT
EVACUATOR 1/8 PVC DRAIN (DRAIN) IMPLANT
GAUZE SPONGE 4X4 16PLY XRAY LF (GAUZE/BANDAGES/DRESSINGS) IMPLANT
GAUZE XEROFORM 5X9 LF (GAUZE/BANDAGES/DRESSINGS) ×3 IMPLANT
GLOVE BIO SURGEON STRL SZ7.5 (GLOVE) ×6 IMPLANT
GLOVE BIO SURGEON STRL SZ8 (GLOVE) ×6 IMPLANT
GLOVE BIOGEL PI IND STRL 7.5 (GLOVE) ×2 IMPLANT
GLOVE BIOGEL PI IND STRL 8 (GLOVE) ×2 IMPLANT
GLOVE BIOGEL PI INDICATOR 7.5 (GLOVE) ×1
GLOVE BIOGEL PI INDICATOR 8 (GLOVE) ×1
GLOVE ECLIPSE 7.5 STRL STRAW (GLOVE) ×3 IMPLANT
GOWN PREVENTION PLUS XLARGE (GOWN DISPOSABLE) ×3 IMPLANT
GOWN PREVENTION PLUS XXLARGE (GOWN DISPOSABLE) ×3 IMPLANT
GOWN STRL NON-REIN LRG LVL3 (GOWN DISPOSABLE) ×3 IMPLANT
GRAFT DERMACARRIERS 1 TO 1.5 (DISPOSABLE) ×2 IMPLANT
HANDPIECE INTERPULSE COAX TIP (DISPOSABLE) ×1
KIT BASIN OR (CUSTOM PROCEDURE TRAY) ×3 IMPLANT
KIT ROOM TURNOVER OR (KITS) ×3 IMPLANT
MANIFOLD NEPTUNE II (INSTRUMENTS) ×3 IMPLANT
NEEDLE 22X1 1/2 (OR ONLY) (NEEDLE) IMPLANT
NS IRRIG 1000ML POUR BTL (IV SOLUTION) ×3 IMPLANT
PACK GENERAL/GYN (CUSTOM PROCEDURE TRAY) ×3 IMPLANT
PACK ORTHO EXTREMITY (CUSTOM PROCEDURE TRAY) ×3 IMPLANT
PAD ARMBOARD 7.5X6 YLW CONV (MISCELLANEOUS) ×6 IMPLANT
PAD CAST 4YDX4 CTTN HI CHSV (CAST SUPPLIES) IMPLANT
PAD NEG PRESSURE SENSATRAC (MISCELLANEOUS) ×3 IMPLANT
PADDING CAST COTTON 4X4 STRL (CAST SUPPLIES)
PADDING CAST COTTON 6X4 STRL (CAST SUPPLIES) IMPLANT
PENCIL BUTTON HOLSTER BLD 10FT (ELECTRODE) ×3 IMPLANT
SET HNDPC FAN SPRY TIP SCT (DISPOSABLE) ×2 IMPLANT
SPONGE GAUZE 4X4 12PLY (GAUZE/BANDAGES/DRESSINGS) ×3 IMPLANT
SPONGE LAP 18X18 X RAY DECT (DISPOSABLE) ×3 IMPLANT
SPONGE SCRUB IODOPHOR (GAUZE/BANDAGES/DRESSINGS) ×3 IMPLANT
STAPLER VISISTAT (STAPLE) IMPLANT
STAPLER VISISTAT 35W (STAPLE) ×3 IMPLANT
STOCKINETTE IMPERVIOUS LG (DRAPES) IMPLANT
STRIP CLOSURE SKIN 1/2X4 (GAUZE/BANDAGES/DRESSINGS) IMPLANT
SUCTION FRAZIER TIP 10 FR DISP (SUCTIONS) IMPLANT
SUT ETHILON 1 TP 1 60 (SUTURE) ×3 IMPLANT
SUT ETHILON 2 0 FS 18 (SUTURE) ×15 IMPLANT
SUT ETHILON 3 0 FSL (SUTURE) IMPLANT
SUT ETHILON 3 0 PS 1 (SUTURE) IMPLANT
SUT PDS AB 2-0 CT1 27 (SUTURE) IMPLANT
SUT PROLENE 0 CT 1 30 (SUTURE) IMPLANT
SUT PROLENE 3 0 PS 2 (SUTURE) ×6 IMPLANT
SUT VIC AB 0 CT1 27 (SUTURE) ×2
SUT VIC AB 0 CT1 27XBRD ANBCTR (SUTURE) ×4 IMPLANT
SUT VIC AB 2-0 CT1 27 (SUTURE)
SUT VIC AB 2-0 CT1 TAPERPNT 27 (SUTURE) IMPLANT
SYR CONTROL 10ML LL (SYRINGE) IMPLANT
TOWEL OR 17X24 6PK STRL BLUE (TOWEL DISPOSABLE) ×6 IMPLANT
TOWEL OR 17X26 10 PK STRL BLUE (TOWEL DISPOSABLE) ×6 IMPLANT
TRAY FOLEY CATH 14FR (SET/KITS/TRAYS/PACK) IMPLANT
TUBE CONNECTING 12X1/4 (SUCTIONS) ×3 IMPLANT
UNDERPAD 30X30 INCONTINENT (UNDERPADS AND DIAPERS) ×3 IMPLANT
WATER STERILE IRR 1000ML POUR (IV SOLUTION) IMPLANT
YANKAUER SUCT BULB TIP NO VENT (SUCTIONS) ×3 IMPLANT

## 2012-10-17 NOTE — Transfer of Care (Signed)
Immediate Anesthesia Transfer of Care Note  Patient: Edwin Rodriguez  Procedure(s) Performed: Procedure(s) (LRB) with comments: SKIN GRAFT SPLIT THICKNESS (Left) - Split thickness skin graft left lower leg from Right Thigh APPLICATION OF WOUND VAC (Left) - re-application of wound vac medial aspect of left lower leg  Patient Location: PACU  Anesthesia Type:General  Level of Consciousness: awake, alert  and oriented  Airway & Oxygen Therapy: Patient Spontanous Breathing and Patient connected to nasal cannula oxygen  Post-op Assessment: Report given to PACU RN and Post -op Vital signs reviewed and stable  Post vital signs: Reviewed and stable  Complications: No apparent anesthesia complications

## 2012-10-17 NOTE — Anesthesia Postprocedure Evaluation (Signed)
  Anesthesia Post-op Note  Patient: Edwin Rodriguez  Procedure(s) Performed: Procedure(s) (LRB) with comments: SKIN GRAFT SPLIT THICKNESS (Left) - Split thickness skin graft left lower leg from Right Thigh APPLICATION OF WOUND VAC (Left) - re-application of wound vac medial aspect of left lower leg  Patient Location: PACU  Anesthesia Type:General  Level of Consciousness: awake  Airway and Oxygen Therapy: Patient Spontanous Breathing  Post-op Pain: mild  Post-op Assessment: Post-op Vital signs reviewed, Patient's Cardiovascular Status Stable, Respiratory Function Stable, Patent Airway, No signs of Nausea or vomiting and Pain level controlled  Post-op Vital Signs: stable  Complications: No apparent anesthesia complications
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
## 2012-10-17 NOTE — Progress Notes (Signed)
OT Cancellation Note  Patient Details Name: Edwin Rodriguez MRN: [id number] DOB: 12-27-1970   Cancelled Treatment:    Reason Eval/Treat Not Completed: Other (comment) (refused due to lack of sleep and pending surgery)  Lucile Shutters Pager: [phone]  10/17/2012, 2:05 PM

## 2012-10-17 NOTE — Brief Op Note (Signed)
10/10/2012 - 10/17/2012  4:30 PM  PATIENT:  Edwin Rodriguez  41 y.o. male  PRE-OPERATIVE DIAGNOSIS:  open fib fx  POST-OPERATIVE DIAGNOSIS:  * No post-op diagnosis entered *  PROCEDURE:  Procedure(s) (LRB) with comments: SKIN GRAFT SPLIT THICKNESS (Left) - Split thickness skin graft left lower leg from Right Thigh APPLICATION OF WOUND VAC (Left) - re-application of wound vac medial aspect of left lower leg Layered closure lateral wound 28 cm  SURGEON:  Surgeon(s) and Role:    * Budd Palmer, MD - Primary  PHYSICIAN ASSISTANT: Montez Morita, [facility]  ANESTHESIA:   general  EBL:  Total I/O In: 1000 [I.V.:1000] Out: 900 [Urine:875; Blood:25]  BLOOD ADMINISTERED:none  DRAINS: wound vac   LOCAL MEDICATIONS USED:  MARCAINE  SPECIMEN:  No Specimen  DISPOSITION OF SPECIMEN:  N/A  COUNTS:  YES  TOURNIQUET:  * Missing tourniquet times found for documented tourniquets in log:  [id number] *  DICTATION: .Other Dictation: Dictation Number [id number]  PLAN OF CARE: Admit to inpatient   PATIENT DISPOSITION:  PACU - hemodynamically stable.   Delay start of Pharmacological VTE agent (>24hrs) due to surgical blood loss or risk of bleeding: no

## 2012-10-17 NOTE — Progress Notes (Signed)
PT Cancellation Note  Patient Details Name: Edwin Rodriguez MRN: [id number] DOB: 03/24/1971   Cancelled Treatment:    Reason Eval/Treat Not Completed: Other (comment) (Pt awaiting surgery at any moment. Declined PT currently.) Will follow-up 10/18/12. Thanks.   Cephus Shelling 10/17/2012, 11:47 AM  10/17/2012 Cephus Shelling, PT, DPT [phone]

## 2012-10-17 NOTE — Anesthesia Procedure Notes (Signed)
Procedure Name: Intubation Date/Time: 10/17/2012 2:20 PM Performed by: Julianne Rice K Pre-anesthesia Checklist: Patient identified, Timeout performed, Emergency Drugs available, Suction available and Patient being monitored Patient Re-evaluated:Patient Re-evaluated prior to inductionOxygen Delivery Method: Circle system utilized Preoxygenation: Pre-oxygenation with 100% oxygen Intubation Type: IV induction Ventilation: Mask ventilation without difficulty Laryngoscope Size: Mac and 3 Grade View: Grade II Tube type: Oral Tube size: 8.0 mm Number of attempts: 1 Airway Equipment and Method: Stylet and LTA kit utilized Placement Confirmation: ETT inserted through vocal cords under direct vision,  breath sounds checked- equal and bilateral and positive ETCO2 Secured at: 24 cm Dental Injury: Teeth and Oropharynx as per pre-operative assessment

## 2012-10-17 NOTE — Progress Notes (Signed)
Spoke with Dr. Gaylan Gerold about elevated B?P said to use post op orders from surgeon

## 2012-10-17 NOTE — Progress Notes (Signed)
ANTICOAGULATION CONSULT NOTE - Initial Consult  Pharmacy Consult for warfarin Indication: VTE prophylaxis  No Known Allergies  Patient Measurements: Height: 6\' 1"  (185.4 cm) Weight: 220 lb (99.791 kg) IBW/kg (Calculated) : 79.9   Vital Signs: Temp: 99.5 F (37.5 C) (11/25 1850) BP: 154/107 mmHg (11/25 1850) Pulse Rate: 86  (11/25 1850)  Labs:  Basename 10/17/12 0820 10/15/12 0650  HGB 10.9* 10.5*  HCT 31.2* 31.0*  PLT 243 177  APTT -- --  LABPROT -- --  INR -- --  HEPARINUNFRC -- --  CREATININE 0.94 0.96  CKTOTAL -- --  CKMB -- --  TROPONINI -- --  INR 1.04 on 11/19  Estimated Creatinine Clearance: 129.9 ml/min (by C-G formula based on Cr of 0.94).   Medical History: Past Medical History  Diagnosis Date  . Hypertension   . DVT (deep venous thrombosis)   . PE (pulmonary embolism)   . Osteomyelitis of left leg     Medications:  Prescriptions prior to admission  Medication Sig Dispense Refill  . quinapril (ACCUPRIL) 20 MG tablet Take 20 mg by mouth daily.        Assessment: 35 year olf man s/p skin graft surgery to today and placement of wound VAC to start warfarin post op for VTE prophylaxis.   Goal of Therapy:  INR 2-3    Plan:  Warfarin 10mg  po x 1 dose Daily protimes Coumadin education materials  Mickeal Skinner 10/17/2012,6:55 PM

## 2012-10-17 NOTE — Progress Notes (Signed)
Pt has had increased pain in left shoulder throughout the night. Pain medications only relieve his pain temporarily. Is able to move arm only while supporting it with his other hand. Pulses normal. No obvious swelling or deformity. Heat provides some relief.

## 2012-10-17 NOTE — Anesthesia Preprocedure Evaluation (Addendum)
Anesthesia Evaluation  Patient identified by MRN, date of birth, ID band Patient awake    Reviewed: Allergy & Precautions, H&P , NPO status , Patient's Chart, lab work & pertinent test results  Airway Mallampati: I TM Distance: >3 FB Neck ROM: full    Dental   Pulmonary Current Smoker,          Cardiovascular hypertension, Pt. on medications Rhythm:regular Rate:Normal     Neuro/Psych negative neurological ROS  negative psych ROS   GI/Hepatic negative GI ROS, (+)     substance abuse  cocaine use and marijuana use,   Endo/Other  negative endocrine ROS  Renal/GU      Musculoskeletal negative musculoskeletal ROS (+)   Abdominal   Peds  Hematology negative hematology ROS (+)   Anesthesia Other Findings   Reproductive/Obstetrics                         Anesthesia Physical Anesthesia Plan  ASA: III  Anesthesia Plan: General   Post-op Pain Management:    Induction: Intravenous  Airway Management Planned: Oral ETT  Additional Equipment:   Intra-op Plan:   Post-operative Plan: Extubation in OR  Informed Consent: I have reviewed the patients History and Physical, chart, labs and discussed the procedure including the risks, benefits and alternatives for the proposed anesthesia with the patient or authorized representative who has indicated his/her understanding and acceptance.   Dental advisory given  Plan Discussed with: Anesthesiologist and Surgeon  Anesthesia Plan Comments:         Anesthesia Quick Evaluation
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
## 2012-10-17 NOTE — Preoperative (Signed)
Beta Blockers   Reason not to administer Beta Blockers:Not Applicable 

## 2012-10-17 NOTE — Progress Notes (Signed)
B/p went down below 110 systolic using PCA

## 2012-10-17 NOTE — Progress Notes (Signed)
Pca initiated

## 2012-10-18 ENCOUNTER — Encounter (HOSPITAL_COMMUNITY): Payer: Self-pay

## 2012-10-18 ENCOUNTER — Encounter (HOSPITAL_COMMUNITY): Payer: Self-pay | Admitting: Orthopedic Surgery

## 2012-10-18 LAB — CBC
HCT: 29.1 % — ABNORMAL LOW (ref 39.0–52.0)
MCH: 31.6 pg (ref 26.0–34.0)
MCV: 92.1 fL (ref 78.0–100.0)
Platelets: 264 10*3/uL (ref 150–400)
RBC: 3.16 MIL/uL — ABNORMAL LOW (ref 4.22–5.81)
RDW: 12.8 % (ref 11.5–15.5)

## 2012-10-18 LAB — PROTIME-INR: Prothrombin Time: 13.6 seconds (ref 11.6–15.2)

## 2012-10-18 LAB — BASIC METABOLIC PANEL
BUN: 15 mg/dL (ref 6–23)
CO2: 30 mEq/L (ref 19–32)
Calcium: 8.7 mg/dL (ref 8.4–10.5)
Creatinine, Ser: 1 mg/dL (ref 0.50–1.35)

## 2012-10-18 MED ORDER — WARFARIN SODIUM 10 MG PO TABS
10.0000 mg | ORAL_TABLET | Freq: Once | ORAL | Status: AC
  Administered 2012-10-18: 10 mg via ORAL
  Filled 2012-10-18: qty 1
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
## 2012-10-18 NOTE — Progress Notes (Signed)
[facility]thopaedic Trauma Service (OTS)  Subjective: 1 Day Post-Op Procedure(s) (LRB): SKIN GRAFT SPLIT THICKNESS (Left) APPLICATION OF WOUND VAC (Left) Patient reports pain as moderate.    Objective: Current Vitals Blood pressure 155/104, pulse 80, temperature 98.4 F (36.9 C), temperature source Oral, resp. rate 18, height 6\' 1"  (1.854 m), weight 220 lb (99.791 kg), SpO2 92.00%. Vital signs in last 24 hours: Temp:  [97.9 F (36.6 C)-99.5 F (37.5 C)] 98.4 F (36.9 C) (11/26 0609) Pulse Rate:  [74-86] 80  (11/26 0609) Resp:  [16-21] 18  (11/26 0721) BP: (146-166)/(102-118) 155/104 mmHg (11/26 0609) SpO2:  [92 %-100 %] 92 % (11/26 0721) FiO2 (%):  [100 %] 100 % (11/25 1733)  Intake/Output from previous day: 11/25 0701 - 11/26 0700 In: 1560 [P.O.:320; I.V.:1240] Out: 1750 [Urine:1725; Blood:25]  LABS  Basename 10/18/12 0530 10/17/12 0820  HGB 10.0* 10.9*    Basename 10/18/12 0530 10/17/12 0820  WBC 5.3 8.0  RBC 3.16* 3.47*  HCT 29.1* 31.2*  PLT 264 243    Basename 10/18/12 0530 10/17/12 0820  NA 133* 133*  K 3.9 5.1  CL 97 95*  CO2 30 31  BUN 15 14  CREATININE 1.00 0.94  GLUCOSE 92 115*  CALCIUM 8.7 9.5    Basename 10/18/12 0530  LABPT --  INR 1.05    Physical Exam  RLE donor site dressing removed and wound looks excellent, xeroform left in place; fan  LLE no soilage and no change in distal exam with decreased SPN,DPN; intact TN s/p trauma, compartment  Imaging No results found.  Assessment/Plan: 1 Day Post-Op Procedure(s) (LRB): SKIN GRAFT SPLIT THICKNESS (Left) APPLICATION OF WOUND VAC (Left)  Bilateral shoulder pain, improved on right, slight improvement on left; cont OT, PT LLE STSG wound vac will maintain until Monday with d/c expected then D/c PCA now, heplock IV Will need to be therapeutic on coumadin for d/c home also given h/o PE  Myrene Galas, MD [facility] [phone] (p)   10/18/2012, 12:31  PM

## 2012-10-18 NOTE — Progress Notes (Signed)
Physical Therapy Treatment Patient Details Name: Edwin Rodriguez MRN: [id number] DOB: 1971/06/05 Today's Date: 10/18/2012 Time: 7829-5621 PT Time Calculation (min): 16 min  PT Assessment / Plan / Recommendation Comments on Treatment Session  Pt admitted s/p ORIF left LE and continues to progress with therapy. Pt able to tolerate increased ambulation distance this am with session. Motivated and doing well.    Follow Up Recommendations  Home health PT     Does the patient have the potential to tolerate intense rehabilitation     Barriers to Discharge        Equipment Recommendations  Rolling walker with 5" wheels    Recommendations for Other Services    Frequency Min 6X/week   Plan Discharge plan remains appropriate;Frequency remains appropriate    Precautions / Restrictions Precautions Precautions: Fall Restrictions Weight Bearing Restrictions: Yes LLE Weight Bearing: Non weight bearing   Pertinent Vitals/Pain 7/10 in left LE. Pt premedicated and repositioned.    Mobility  Bed Mobility Bed Mobility: Not assessed Transfers Transfers: Sit to Stand;Stand to Sit Sit to Stand: 5: Supervision;With upper extremity assist;From chair/3-in-1 Stand to Sit: 5: Supervision;With upper extremity assist;To chair/3-in-1 Details for Transfer Assistance: Verbal cues for safe sequence and safest hand placement. Ambulation/Gait Ambulation/Gait Assistance: 4: Min guard Ambulation Distance (Feet): 100 Feet Assistive device: Rolling walker Ambulation/Gait Assistance Details: Guarding for balance and cues for tall posture/safety inside RW to ensure NWBing left LE. Gait Pattern: Step-to pattern;Decreased step length - right;Trunk flexed Gait velocity: slow Stairs: No Wheelchair Mobility Wheelchair Mobility: No    Exercises     PT Diagnosis:    PT Problem List:   PT Treatment Interventions:     PT Goals Acute Rehab PT Goals PT Goal Formulation: With patient Time For Goal  Achievement: 10/19/12 Potential to Achieve Goals: Good PT Goal: Sit to Stand - Progress: Progressing toward goal PT Goal: Stand to Sit - Progress: Progressing toward goal PT Goal: Ambulate - Progress: Progressing toward goal  Visit Information  Last PT Received On: 10/18/12 Assistance Needed: +1    Subjective Data  Subjective: "Let's go and get this over with." Patient Stated Goal: d/c home   Cognition  Overall Cognitive Status: Appears within functional limits for tasks assessed/performed Arousal/Alertness: Awake/alert Orientation Level: Appears intact for tasks assessed Behavior During Session: [facility] for tasks performed    Balance  Balance Balance Assessed: No  End of Session PT - End of Session Equipment Utilized During Treatment: Gait belt Activity Tolerance: Patient tolerated treatment well Patient left: in chair;with call bell/phone within reach Nurse Communication: Mobility status   GP     Cephus Shelling 10/18/2012, 12:28 PM  10/18/2012 Cephus Shelling, PT, DPT [phone]

## 2012-10-18 NOTE — Progress Notes (Addendum)
ANTICOAGULATION CONSULT NOTE -Follow up  Pharmacy Consult for warfarin Indication: VTE prophylaxis  No Known Allergies  Patient Measurements: Height: 6\' 1"  (185.4 cm) Weight: 220 lb (99.791 kg) IBW/kg (Calculated) : 79.9   Vital Signs: Temp: 98.4 F (36.9 C) (11/26 0609) BP: 155/104 mmHg (11/26 0609) Pulse Rate: 80  (11/26 0609)  Labs:  Basename 10/18/12 0530 10/17/12 0820  HGB 10.0* 10.9*  HCT 29.1* 31.2*  PLT 264 243  APTT -- --  LABPROT 13.6 --  INR 1.05 --  HEPARINUNFRC -- --  CREATININE 1.00 0.94  CKTOTAL -- --  CKMB -- --  TROPONINI -- --  INR 1.04 on 11/19  Estimated Creatinine Clearance: 122.1 ml/min (by C-G formula based on Cr of 1).   Medical History: Past Medical History  Diagnosis Date  . Hypertension   . DVT (deep venous thrombosis)   . PE (pulmonary embolism)   . Osteomyelitis of left leg     Medications:  Prescriptions prior to admission  Medication Sig Dispense Refill  . quinapril (ACCUPRIL) 20 MG tablet Take 20 mg by mouth daily.        Assessment: INR = 1.05 in this 41 y.o male s/p skin graft surgery yesterday and placement of wound VAC. Started on warfarin and Lovenox 40mg  SQ q24h post op for VTE prophylaxis.  Now POD#1, H/H 10.0/29.1 decreased from preop 15.3/45 on 10/10/12 and pltc 264K today. No bleeding reported. He has a h/o PE/DVT in past;  Not on coumadin prior to admit. Dr. Carola Frost notes that INR will need to be therapeutic on coumadin for d/c home given h/o PE.   Warfarin predictor point score = 10  Goal of Therapy:  INR 2-3    Plan:  Warfarin 10mg  po x 1 dose Daily protimes Educate patient about coumadin for VTE prophylaxis. Continue on Lovenox 40mg  SQ q24h until INR therapeutic.  Noah Delaine, RPh Clinical Pharmacist Pager: [phone] 10/18/2012,2:24 PM
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
## 2012-10-18 NOTE — Progress Notes (Signed)
Occupational Therapy Treatment Patient Details Name: Edwin Rodriguez MRN: [id number] DOB: 1971/01/20 Today's Date: 10/18/2012 Time: 9528-4132 OT Time Calculation (min): 14 min  OT Assessment / Plan / Recommendation Comments on Treatment Session Pt progressing well with OT at this time. Pt requires (A) due to lines and leads at this time. Pt is able to maintain NWB throughout adls. Pt is able to direct care.    Follow Up Recommendations  Home health OT    Barriers to Discharge       Equipment Recommendations  Rolling walker with 5" wheels    Recommendations for Other Services    Frequency Min 2X/week   Plan Discharge plan remains appropriate    Precautions / Restrictions Precautions Precautions: Fall Restrictions Weight Bearing Restrictions: Yes LLE Weight Bearing: Non weight bearing   Pertinent Vitals/Pain C/o pain with LLE in dependent position for extended time with bathing. Pushed PCA x1 at end of session    ADL  Eating/Feeding: Modified independent Where Assessed - Eating/Feeding: Chair Grooming: Wash/dry hands;Wash/dry face;Teeth care;Modified independent Where Assessed - Grooming: Unsupported sitting Upper Body Bathing: Chest;Right arm;Left arm;Abdomen;Modified independent Where Assessed - Upper Body Bathing: Unsupported sitting Lower Body Bathing: Supervision/safety Where Assessed - Lower Body Bathing: Unsupported sit to stand Upper Body Dressing: Modified independent Where Assessed - Upper Body Dressing: Unsupported sitting Lower Body Dressing: Supervision/safety Where Assessed - Lower Body Dressing: Unsupported sit to stand Toilet Transfer: Modified independent Toilet Transfer Method: Sit to stand Toilet Transfer Equipment: Raised toilet seat with arms (or 3-in-1 over toilet) Equipment Used: Rolling walker Transfers/Ambulation Related to ADLs: Pt completing bath at sink level sitting on 3n1 on arrival. pt ambulating Supervision levle due to lines and leads  only. ADL Comments: Pt is able to complete bathing if the environment is setup prior to patient starting. pt completes sit<>stand transfers mod I. Pt is able to verbalize precautions and direct care.    OT Diagnosis:    OT Problem List:   OT Treatment Interventions:     OT Goals Acute Rehab OT Goals Time For Goal Achievement: 10/27/12 Potential to Achieve Goals: Good ADL Goals Pt Will Perform Grooming: with modified independence;Standing at sink;Unsupported ADL Goal: Grooming - Progress: Progressing toward goals Pt Will Perform Lower Body Bathing: with modified independence;Sit to stand from chair;with adaptive equipment ADL Goal: Lower Body Bathing - Progress: Progressing toward goals Pt Will Perform Lower Body Dressing: with modified independence;Sit to stand from chair;with adaptive equipment ADL Goal: Lower Body Dressing - Progress: Progressing toward goals Pt Will Transfer to Toilet: with modified independence;Ambulation;3-in-1 ADL Goal: Toilet Transfer - Progress: Progressing toward goals Miscellaneous OT Goals Miscellaneous OT Goal #1: Pt will perform bed mobility supervision level with hOB flat as precursor for adls  Visit Information  Last OT Received On: 10/18/12 Assistance Needed: +1    Subjective Data      Prior Functioning       Cognition  Overall Cognitive Status: Appears within functional limits for tasks assessed/performed Arousal/Alertness: Awake/alert Orientation Level: Appears intact for tasks assessed Behavior During Session: [facility] for tasks performed    Mobility  Shoulder Instructions Bed Mobility Bed Mobility: Not assessed Transfers Sit to Stand: 5: Supervision;With upper extremity assist;From chair/3-in-1 Stand to Sit: 5: Supervision;With upper extremity assist;To chair/3-in-1 Details for Transfer Assistance: Verbal cues for safe sequence and safest hand placement.       Exercises      Balance Balance Balance Assessed: No   End of Session  OT - End of Session Activity  Tolerance: Patient tolerated treatment well Patient left: in chair;with call bell/phone within reach Nurse Communication: Mobility status;Precautions  GO     Lucile Shutters 10/18/2012, 1:16 PM Pager: [phone]

## 2012-10-19 LAB — CBC
HCT: 29.5 % — ABNORMAL LOW (ref 39.0–52.0)
Hemoglobin: 9.7 g/dL — ABNORMAL LOW (ref 13.0–17.0)
MCH: 29.8 pg (ref 26.0–34.0)
MCHC: 32.9 g/dL (ref 30.0–36.0)
RDW: 12.9 % (ref 11.5–15.5)

## 2012-10-19 LAB — PROTIME-INR: Prothrombin Time: 15.2 seconds (ref 11.6–15.2)

## 2012-10-19 LAB — BASIC METABOLIC PANEL
BUN: 15 mg/dL (ref 6–23)
Calcium: 9.5 mg/dL (ref 8.4–10.5)
GFR calc non Af Amer: >90 mL/min (ref >90)
Glucose, Bld: 108 mg/dL — ABNORMAL HIGH (ref 70–99)

## 2012-10-19 MED ORDER — LISINOPRIL 10 MG PO TABS
10.0000 mg | ORAL_TABLET | Freq: Every day | ORAL | Status: DC
  Filled 2012-10-19: qty 1

## 2012-10-19 MED ORDER — LISINOPRIL 5 MG PO TABS
5.0000 mg | ORAL_TABLET | Freq: Every day | ORAL | Status: DC
  Administered 2012-10-20 – 2012-10-24 (×5): 5 mg via ORAL
  Filled 2012-10-19 (×6): qty 1

## 2012-10-19 MED ORDER — WARFARIN SODIUM 10 MG PO TABS
10.0000 mg | ORAL_TABLET | Freq: Once | ORAL | Status: AC
  Administered 2012-10-19: 10 mg via ORAL
  Filled 2012-10-19: qty 1
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
## 2012-10-19 NOTE — Progress Notes (Signed)
Physical Therapy Treatment Patient Details Name: Edwin Rodriguez MRN: [id number] DOB: 01/22/1971 Today's Date: 10/19/2012 Time: 4098-1191 PT Time Calculation (min): 15 min  PT Assessment / Plan / Recommendation Comments on Treatment Session  Pt admitted s/p ORIF left LE and is motivated to work with therapy. Pt able to increase ambulation distance and tolerance to mobility daily. Encouraged to ambulate with RN staff as well as much as able.    Follow Up Recommendations  Home health PT     Does the patient have the potential to tolerate intense rehabilitation     Barriers to Discharge        Equipment Recommendations  Rolling walker with 5" wheels    Recommendations for Other Services    Frequency Min 6X/week   Plan Discharge plan remains appropriate;Frequency remains appropriate    Precautions / Restrictions Precautions Precautions: Fall Restrictions Weight Bearing Restrictions: Yes LLE Weight Bearing: Touchdown weight bearing   Pertinent Vitals/Pain 3/10 in left LE. Pt repositioned.    Mobility  Bed Mobility Bed Mobility: Not assessed Transfers Transfers: Sit to Stand;Stand to Sit Sit to Stand: 6: Modified independent (Device/Increase time);With upper extremity assist;From chair/3-in-1 Stand to Sit: 6: Modified independent (Device/Increase time);With upper extremity assist;To chair/3-in-1 Ambulation/Gait Ambulation/Gait Assistance: 4: Min guard Ambulation Distance (Feet): 130 Feet Assistive device: Rolling walker Ambulation/Gait Assistance Details: Guarding for balance with cues for tall posture and safe use of RW. Gait Pattern: Step-to pattern;Decreased step length - right;Trunk flexed Gait velocity: slow Stairs: No Wheelchair Mobility Wheelchair Mobility: No    Exercises     PT Diagnosis:    PT Problem List:   PT Treatment Interventions:     PT Goals Acute Rehab PT Goals PT Goal Formulation: With patient Time For Goal Achievement: 10/26/12 Potential to  Achieve Goals: Good PT Goal: Sit to Stand - Progress: Met PT Goal: Stand to Sit - Progress: Met PT Goal: Ambulate - Progress: Progressing toward goal  Visit Information  Last PT Received On: 10/19/12 Assistance Needed: +1    Subjective Data  Subjective: "I definitely need to walk today." Patient Stated Goal: d/c home   Cognition  Overall Cognitive Status: Appears within functional limits for tasks assessed/performed Arousal/Alertness: Awake/alert Orientation Level: Appears intact for tasks assessed Behavior During Session: [facility] for tasks performed    Balance  Balance Balance Assessed: No  End of Session PT - End of Session Equipment Utilized During Treatment: Gait belt Activity Tolerance: Patient tolerated treatment well Patient left: in chair;with call bell/phone within reach Nurse Communication: Mobility status   GP     Cephus Shelling 10/19/2012, 11:48 AM  10/19/2012 Cephus Shelling, PT, DPT [phone]
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
## 2012-10-20 MED ORDER — WARFARIN SODIUM 10 MG PO TABS
10.0000 mg | ORAL_TABLET | Freq: Once | ORAL | Status: AC
  Administered 2012-10-20: 10 mg via ORAL
  Filled 2012-10-20: qty 1
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
## 2012-10-21 MED ORDER — WARFARIN SODIUM 10 MG PO TABS
10.0000 mg | ORAL_TABLET | Freq: Once | ORAL | Status: AC
  Administered 2012-10-21: 10 mg via ORAL
  Filled 2012-10-21: qty 1

## 2012-10-21 NOTE — Progress Notes (Signed)
Subjective: 4 Days Post-Op Procedure(s) (LRB): SKIN GRAFT SPLIT THICKNESS (Left) APPLICATION OF WOUND VAC (Left) Patient reports pain as 3 on 0-10 scale.    Objective: Vital signs in last 24 hours: Temp:  [98.6 F (37 C)-99.4 F (37.4 C)] 98.6 F (37 C) (11/29 0627) Pulse Rate:  [84-88] 84  (11/29 0627) Resp:  [18-20] 18  (11/29 0627) BP: (115-143)/(71-83) 115/83 mmHg (11/29 0627) SpO2:  [97 %-100 %] 97 % (11/29 0627)  Intake/Output from previous day: 11/28 0701 - 11/29 0700 In: 1440 [P.O.:1440] Out: 2650 [Urine:2650] Intake/Output this shift:     Basename 10/19/12 0600  HGB 9.7*    Basename 10/19/12 0600  WBC 4.1  RBC 3.26*  HCT 29.5*  PLT 312    Basename 10/19/12 0600  NA 135  K 4.4  CL 98  CO2 27  BUN 15  CREATININE 1.00  GLUCOSE 108*  CALCIUM 9.5    Basename 10/21/12 0620 10/20/12 0550  LABPT -- --  INR 1.54* 1.43    ABD soft Neurovascular intact Sensation intact distally Dorsiflexion/Plantar flexion intact Incision: dressing C/D/I  Assessment/Plan: 4 Days Post-Op Procedure(s) (LRB): SKIN GRAFT SPLIT THICKNESS (Left) APPLICATION OF WOUND VAC (Left) Up with therapy Continue ABX therapy due to Post-op infection Discharge home with home health  Edwin Rodriguez 10/21/2012, 9:13 AM

## 2012-10-21 NOTE — Progress Notes (Signed)
ANTICOAGULATION CONSULT NOTE -Follow up  Pharmacy Consult for warfarin Indication: VTE prophylaxis  No Known Allergies  Patient Measurements: Height: 6\' 1"  (185.4 cm) Weight: 220 lb (99.791 kg) IBW/kg (Calculated) : 79.9   Vital Signs: Temp: 98.6 F (37 C) (11/29 0627) BP: 115/83 mmHg (11/29 0627) Pulse Rate: 84  (11/29 0627)  Labs:  Basename 10/21/12 0620 10/20/12 0550 10/19/12 0600  HGB -- -- 9.7*  HCT -- -- 29.5*  PLT -- -- 312  APTT -- -- --  LABPROT 18.0* 17.1* 15.2  INR 1.54* 1.43 1.22  HEPARINUNFRC -- -- --  CREATININE -- -- 1.00  CKTOTAL -- -- --  CKMB -- -- --  TROPONINI -- -- --  INR 1.04 on 11/19  Estimated Creatinine Clearance: 122.1 ml/min (by C-G formula based on Cr of 1).   Medical History: Past Medical History  Diagnosis Date  . Hypertension   . DVT (deep venous thrombosis)   . PE (pulmonary embolism)   . Osteomyelitis of left leg     Medications:  Prescriptions prior to admission  Medication Sig Dispense Refill  . quinapril (ACCUPRIL) 20 MG tablet Take 20 mg by mouth daily.        Assessment: Edwin Rodriguez is a 41 yo male with a history of alcohol abuse and PE/DVT on warfarin and Lovenox 40mg  SQ q24h post op for VTE prophylaxis.  His INR is trending up nicely to 1.54 on 10mg  daily with no bleeding noted. He has a h/o acute PE about one year ago at [facility] and was treated with coumadin for about 6 months total per pt report.  There is concern for warfarin dosing on d/c because ETOH in large quantities can increase warfarin effect. AST high on admit;  LFTs within normal on 10/17/12.  Warfarin predictor point score = 10 points  Goal of Therapy:  INR 2-3  Plan:  Warfarin 10mg  po x 1 dose Daily PT/INR Continue on Lovenox 40mg  SQ q24h until INR therapeutic.  Thank you,  Brett Fairy, PharmD, BCPS 10/21/2012 8:44 AM

## 2012-10-21 NOTE — Progress Notes (Signed)
Physical Therapy Treatment Patient Details Name: Edwin Rodriguez MRN: [id number] DOB: 1971/03/08 Today's Date: 10/21/2012 Time: 7829-5621 PT Time Calculation (min): 20 min  PT Assessment / Plan / Recommendation Comments on Treatment Session  Patient limited by pain this session. Progressing with ambulation. Up earlier with nursing staff.     Follow Up Recommendations  Home health PT     Does the patient have the potential to tolerate intense rehabilitation     Barriers to Discharge        Equipment Recommendations  Rolling walker with 5" wheels    Recommendations for Other Services    Frequency Min 6X/week   Plan Discharge plan remains appropriate;Frequency remains appropriate    Precautions / Restrictions Precautions Precautions: Fall Restrictions Weight Bearing Restrictions: Yes LLE Weight Bearing: Non weight bearing   Pertinent Vitals/Pain     Mobility  Transfers Sit to Stand: 5: Supervision;With upper extremity assist;From chair/3-in-1 Stand to Sit: 5: Supervision;With upper extremity assist;To chair/3-in-1 Details for Transfer Assistance: x2 with supervision for safety with cues to sit slowly Ambulation/Gait Ambulation/Gait Assistance: 5: Supervision Ambulation Distance (Feet): 100 Feet Assistive device: Rolling walker Ambulation/Gait Assistance Details: Cues for safe management of RW Gait Pattern: Step-to pattern Gait velocity: decreased    Exercises     PT Diagnosis:    PT Problem List:   PT Treatment Interventions:     PT Goals Acute Rehab PT Goals PT Goal: Sit to Stand - Progress: Progressing toward goal PT Goal: Stand to Sit - Progress: Progressing toward goal PT Goal: Ambulate - Progress: Other (comment)  Visit Information  Last PT Received On: 10/21/12 Assistance Needed: +1    Subjective Data      Cognition  Overall Cognitive Status: Appears within functional limits for tasks assessed/performed Arousal/Alertness: Awake/alert Orientation  Level: Appears intact for tasks assessed Behavior During Session: [facility] for tasks performed    Balance     End of Session PT - End of Session Equipment Utilized During Treatment: Gait belt Activity Tolerance: Patient tolerated treatment well Patient left: in chair;with call bell/phone within reach Nurse Communication: Mobility status   GP     Fredrich Birks 10/21/2012, 10:15 AM 10/21/2012 Fredrich Birks PTA [phone] pager [phone] office

## 2012-10-21 NOTE — Progress Notes (Signed)
Clinical Social Worker received referral for SNF.  Per notes and RN, plan is to dc home with home health on Monday.  CSW to sign off, please re consult if needed.   Angelia Mould, MSW, [place] [phone]
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
## 2012-10-22 LAB — PROTIME-INR: INR: 1.54 — ABNORMAL HIGH (ref 0.00–1.49)

## 2012-10-22 MED ORDER — WARFARIN SODIUM 6 MG PO TABS
12.0000 mg | ORAL_TABLET | Freq: Once | ORAL | Status: AC
  Administered 2012-10-22: 12 mg via ORAL
  Filled 2012-10-22: qty 2
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
## 2012-10-22 NOTE — Progress Notes (Signed)
ANTICOAGULATION CONSULT NOTE - Follow Up Consult  Pharmacy Consult for warfarin Indication: VTE prophylaxis  No Known Allergies  Patient Measurements: Height: 6\' 1"  (185.4 cm) Weight: 220 lb (99.791 kg) IBW/kg (Calculated) : 79.9    Vital Signs: Temp: 98.9 F (37.2 C) (11/30 0627) Temp src: Oral (11/29 2200) BP: 119/59 mmHg (11/30 0627) Pulse Rate: 84  (11/30 0627)  Labs:  Basename 10/22/12 0538 10/21/12 0620 10/20/12 0550  HGB -- -- --  HCT -- -- --  PLT -- -- --  APTT -- -- --  LABPROT 18.0* 18.0* 17.1*  INR 1.54* 1.54* 1.43  HEPARINUNFRC -- -- --  CREATININE -- -- --  CKTOTAL -- -- --  CKMB -- -- --  TROPONINI -- -- --    Estimated Creatinine Clearance: 122.1 ml/min (by C-G formula based on Cr of 1).   Medications:  Scheduled:    . carvedilol  6.25 mg Oral BID WC  .  ceFAZolin (ANCEF) IV  1 g Intravenous Q8H  . cloNIDine  0.3 mg Oral BID  . coumadin book   Does not apply Once  . docusate sodium  100 mg Oral BID  . enoxaparin (LOVENOX) injection  40 mg Subcutaneous Q24H  . folic acid  1 mg Oral Daily  . gabapentin  300 mg Oral BID  . isosorbide mononitrate  30 mg Oral Daily  . lisinopril  5 mg Oral Daily  . multivitamin with minerals  1 tablet Oral Daily  . thiamine  100 mg Oral Daily  . [COMPLETED] warfarin  10 mg Oral ONCE-1800  . warfarin  12 mg Oral ONCE-1800  . warfarin   Does not apply Once  . Warfarin - Pharmacist Dosing Inpatient   Does not apply q1800  . [DISCONTINUED] thiamine  100 mg Intravenous Daily    Assessment: 41 yo male with a history of alcohol abuse and PE/DVT on warfarin and Lovenox 40mg  SQ q24h post op for VTE prophylaxis (none PTA). His INR today is stable at 1.54 (yest. INR 1.54)  on 10mg  daily after 5 doses of warfarin with no bleeding noted per RN. There is concern for warfarin dosing on d/c because ETOH in large quantities can increase warfarin effect.Dr. Carola Frost noted that INR will need to be therapeutic on coumadin for d/c  home given h/o PE.  Warfarin predictor point score = 10 points  Goal of Therapy:  INR 2-3 Monitor platelets by anticoagulation protocol: Yes   Plan:  1.Will increase warfarin to 12 mg x 1 2. F/U INR in AM 3.Continue on Lovenox 40mg  SQ q24h until INR therapeutic. 4. Monitor for s/sx of bleeding  Bola A. Wandra Feinstein D Clinical Pharmacist [phone] Phone [phone] 10/22/2012 8:12 AM

## 2012-10-22 NOTE — Progress Notes (Signed)
PT Cancellation Note  Patient Details Name: Edwin Rodriguez MRN: [id number] DOB: 19-Oct-1971   Cancelled Treatment:    Reason Eval/Treat Not Completed: Other (comment) (pt refused)   Milana Kidney 10/22/2012, 1:49 PM

## 2012-10-23 LAB — PROTIME-INR
INR: 1.96 — ABNORMAL HIGH (ref 0.00–1.49)
Prothrombin Time: 21.6 seconds — ABNORMAL HIGH (ref 11.6–15.2)

## 2012-10-23 LAB — BASIC METABOLIC PANEL
CO2: 30 mEq/L (ref 19–32)
Calcium: 9.6 mg/dL (ref 8.4–10.5)
Chloride: 99 mEq/L (ref 96–112)
Potassium: 4.8 mEq/L (ref 3.5–5.1)
Sodium: 135 mEq/L (ref 135–145)

## 2012-10-23 LAB — CBC
Platelets: 406 10*3/uL — ABNORMAL HIGH (ref 150–400)
RBC: 3.47 MIL/uL — ABNORMAL LOW (ref 4.22–5.81)
WBC: 4.4 10*3/uL (ref 4.0–10.5)

## 2012-10-23 MED ORDER — FOLIC ACID 1 MG PO TABS: 1.0000 mg | ORAL_TABLET | Freq: Every day | ORAL | Status: DC

## 2012-10-23 MED ORDER — LISINOPRIL 5 MG PO TABS: 5.0000 mg | ORAL_TABLET | Freq: Every day | ORAL | Status: DC

## 2012-10-23 MED ORDER — WARFARIN SODIUM 10 MG PO TABS
10.0000 mg | ORAL_TABLET | Freq: Once | ORAL | Status: AC
  Administered 2012-10-23: 10 mg via ORAL
  Filled 2012-10-23: qty 1

## 2012-10-23 MED ORDER — CLONIDINE HCL 0.3 MG PO TABS: 0.3000 mg | ORAL_TABLET | Freq: Two times a day (BID) | ORAL | Status: DC

## 2012-10-23 MED ORDER — CARVEDILOL 6.25 MG PO TABS: 6.2500 mg | ORAL_TABLET | Freq: Two times a day (BID) | ORAL | Status: DC

## 2012-10-23 MED ORDER — THIAMINE HCL 100 MG PO TABS: 100.0000 mg | ORAL_TABLET | Freq: Every day | ORAL | Status: DC
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
## 2012-10-23 NOTE — Progress Notes (Signed)
ANTICOAGULATION CONSULT NOTE - Follow Up Consult  Pharmacy Consult for warfarin Indication: VTE prophylaxis  No Known Allergies  Patient Measurements: Height: 6\' 1"  (185.4 cm) Weight: 220 lb (99.791 kg) IBW/kg (Calculated) : 79.9    Vital Signs: Temp: 98.3 F (36.8 C) (12/01 0617) BP: 124/72 mmHg (12/01 0617) Pulse Rate: 70  (12/01 0617)  Labs:  Basename 10/23/12 0500 10/22/12 0538 10/21/12 0620  HGB 10.7* -- --  HCT 31.6* -- --  PLT 406* -- --  APTT -- -- --  LABPROT 21.6* 18.0* 18.0*  INR 1.96* 1.54* 1.54*  HEPARINUNFRC -- -- --  CREATININE 0.99 -- --  CKTOTAL -- -- --  CKMB -- -- --  TROPONINI -- -- --    Estimated Creatinine Clearance: 123.3 ml/min (by C-G formula based on Cr of 0.99).   Medications:  Scheduled:    . carvedilol  6.25 mg Oral BID WC  .  ceFAZolin (ANCEF) IV  1 g Intravenous Q8H  . cloNIDine  0.3 mg Oral BID  . coumadin book   Does not apply Once  . docusate sodium  100 mg Oral BID  . enoxaparin (LOVENOX) injection  40 mg Subcutaneous Q24H  . folic acid  1 mg Oral Daily  . gabapentin  300 mg Oral BID  . lisinopril  5 mg Oral Daily  . multivitamin with minerals  1 tablet Oral Daily  . thiamine  100 mg Oral Daily  . warfarin  10 mg Oral ONCE-1800  . [COMPLETED] warfarin  12 mg Oral ONCE-1800  . warfarin   Does not apply Once  . Warfarin - Pharmacist Dosing Inpatient   Does not apply q1800  . [DISCONTINUED] isosorbide mononitrate  30 mg Oral Daily    Assessment: 41 yo male with a history of alcohol abuse and PE/DVT. On warfarin and Lovenox 40mg  SQ q24h post op for VTE prophylaxis (none PTA). His INR today is 1.96 (yest. INR 1.54) with no bleeding noted per RN, expect INR to be therapeutic tomorrow. There is concern for warfarin dosing on d/c because ETOH in large quantities can increase warfarin effect.Dr. Carola Frost noted that INR will need to be therapeutic on coumadin for d/c home given h/o PE.  Goal of Therapy:  INR 2-3 Monitor platelets  by anticoagulation protocol: Yes   Plan:  1.Warfarin 10 mg x 1  2. F/U INR in AM  3.Continue on Lovenox 40mg  SQ q24h until INR therapeutic.  4. Monitor for s/sx of bleeding  Bola A. Wandra Feinstein D Clinical Pharmacist Pager:[phone] Phone [phone] 10/23/2012 7:57 AM

## 2012-10-24 MED ORDER — OXYCODONE-ACETAMINOPHEN 5-325 MG PO TABS: 1.0000 | ORAL_TABLET | Freq: Four times a day (QID) | ORAL | Status: DC | PRN

## 2012-10-24 MED ORDER — COUMADIN BOOK: 1.0000 | Freq: Once | Status: DC

## 2012-10-24 MED ORDER — WARFARIN SODIUM 5 MG PO TABS: 5.0000 mg | ORAL_TABLET | Freq: Every day | ORAL | Status: DC

## 2012-10-24 MED ORDER — OXYCODONE HCL 5 MG PO TABS: 5.0000 mg | ORAL_TABLET | ORAL | Status: DC | PRN

## 2012-10-24 MED ORDER — DSS 100 MG PO CAPS: 100.0000 mg | ORAL_CAPSULE | Freq: Two times a day (BID) | ORAL | Status: DC

## 2012-10-24 MED ORDER — METHOCARBAMOL 500 MG PO TABS: 500.0000 mg | ORAL_TABLET | Freq: Four times a day (QID) | ORAL | Status: DC | PRN
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
## 2012-10-24 NOTE — Progress Notes (Signed)
Occupational Therapy Treatment Patient Details Name: Edwin Rodriguez MRN: [id number] DOB: Apr 25, 1971 Today's Date: 10/24/2012 Time: 4098-1191 OT Time Calculation (min): 8 min  OT Assessment / Plan / Recommendation Comments on Treatment Session Pt is at adequate level for d/c home.    Follow Up Recommendations  Home health OT    Barriers to Discharge       Equipment Recommendations  Rolling walker with 5" wheels    Recommendations for Other Services    Frequency Min 2X/week   Plan Discharge plan remains appropriate    Precautions / Restrictions Precautions Precautions: Fall Restrictions Weight Bearing Restrictions: Yes LLE Weight Bearing: Non weight bearing   Pertinent Vitals/Pain Painful with sitting in dependent position and standing Pt able to tolerate and reports decrease pain with LT LE elevated    ADL  Lower Body Dressing: Minimal assistance Where Assessed - Lower Body Dressing: Unsupported sit to stand Toilet Transfer: Supervision/safety Toilet Transfer Method: Sit to stand Toilet Transfer Equipment: Raised toilet seat with arms (or 3-in-1 over toilet) Toileting - Clothing Manipulation and Hygiene: Supervision/safety Where Assessed - Toileting Clothing Manipulation and Hygiene: Sit to stand from 3-in-1 or toilet Equipment Used: Rolling walker Transfers/Ambulation Related to ADLs: pt ambulating at end of session with PTA Raynelle Fanning Supervision level ADL Comments: pt needed assistance cutting scrub pant bottoms to pull over external fixator. Pt able to don pants with min (A) due to first attempt. pt educated on dressing operated leg first and Rt leg second. Pt don shoe MOD I. Pt is eager to leave and reports increased pain in Lt LE today. Pt also reports that he did not get up yesterday and that he understands that might be why. pt is adequate level to dc home    OT Diagnosis:    OT Problem List:   OT Treatment Interventions:     OT Goals Acute Rehab OT Goals OT Goal  Formulation: With patient Time For Goal Achievement: 10/27/12 Potential to Achieve Goals: Good ADL Goals Pt Will Perform Grooming: with modified independence;Standing at sink;Unsupported (remains appropriate) ADL Goal: Grooming - Progress: Progressing toward goals Pt Will Perform Lower Body Bathing: with modified independence;Sit to stand from chair;with adaptive equipment (remains appropriate) ADL Goal: Lower Body Bathing - Progress: Progressing toward goals Pt Will Perform Lower Body Dressing: with modified independence;Sit to stand from chair;with adaptive equipment (remains appropriate) ADL Goal: Lower Body Dressing - Progress: Progressing toward goals Pt Will Transfer to Toilet: with modified independence;Ambulation;3-in-1 (remains appropriate) ADL Goal: Toilet Transfer - Progress: Progressing toward goals Miscellaneous OT Goals Miscellaneous OT Goal #1: Pt will perform bed mobility supervision level with hOB flat as precursor for adls OT Goal: Miscellaneous Goal #1 - Progress: Met  Visit Information  Last OT Received On: 10/24/12    Subjective Data      Prior Functioning       Cognition  Overall Cognitive Status: Appears within functional limits for tasks assessed/performed Arousal/Alertness: Awake/alert Orientation Level: Appears intact for tasks assessed Behavior During Session: [facility] for tasks performed    Mobility  Shoulder Instructions Bed Mobility Bed Mobility: Supine to Sit;Sitting - Scoot to Edge of Bed Supine to Sit: 6: Modified independent (Device/Increase time) Sitting - Scoot to Edge of Bed: 6: Modified independent (Device/Increase time) Transfers Sit to Stand: 5: Supervision;From bed       Exercises      Balance     End of Session OT - End of Session Activity Tolerance: Patient tolerated treatment well Patient left:  (with  PTA Raynelle Fanning) Nurse Communication: Mobility status;Precautions  GO     Harrel Carina [facility] 10/24/2012, 10:31 AM Pager:  [phone]

## 2012-10-24 NOTE — Progress Notes (Signed)
Delivered walker to the patient's room.  Explained to the patient we do not carry elevated toilet seats here in the hospital and would need to pick that up at the store.     Edwin Rodriguez [phone]

## 2012-10-24 NOTE — Progress Notes (Signed)
Agree with d/c PT.  10/24/2012 Cephus Shelling, PT, DPT [phone]

## 2012-10-24 NOTE — Progress Notes (Signed)
Physical Therapy Treatment Patient Details Name: Edwin Rodriguez MRN: [id number] DOB: 1971/10/12 Today's Date: 10/24/2012 Time: 5284-1324 PT Time Calculation (min): 11 min  PT Assessment / Plan / Recommendation Comments on Treatment Session  Patient progressing well. Wound vac off now. Will sign off    Follow Up Recommendations  Home health PT     Does the patient have the potential to tolerate intense rehabilitation     Barriers to Discharge        Equipment Recommendations  Rolling walker with 5" wheels    Recommendations for Other Services    Frequency     Plan All goals met and education completed, patient dischaged from PT services    Precautions / Restrictions Precautions Precautions: Fall Restrictions Weight Bearing Restrictions: Yes LLE Weight Bearing: Non weight bearing   Pertinent Vitals/Pain     Mobility  Bed Mobility Bed Mobility: Supine to Sit;Sitting - Scoot to Edge of Bed Supine to Sit: 6: Modified independent (Device/Increase time) Sitting - Scoot to Edge of Bed: 6: Modified independent (Device/Increase time) Sit to Supine: 6: Modified independent (Device/Increase time) Transfers Sit to Stand: 6: Modified independent (Device/Increase time) Stand to Sit: 6: Modified independent (Device/Increase time) Ambulation/Gait Ambulation/Gait Assistance: 6: Modified independent (Device/Increase time) Ambulation Distance (Feet): 60 Feet Assistive device: Rolling walker Gait Pattern: Step-to pattern    Exercises     PT Diagnosis:    PT Problem List:   PT Treatment Interventions:     PT Goals Acute Rehab PT Goals PT Goal: Supine/Side to Sit - Progress: Met PT Goal: Sit to Supine/Side - Progress: Met PT Goal: Sit to Stand - Progress: Met PT Goal: Stand to Sit - Progress: Met PT Goal: Ambulate - Progress: Met  Visit Information  Last PT Received On: 10/24/12 Assistance Needed: +1    Subjective Data      Cognition  Overall Cognitive Status: Appears  within functional limits for tasks assessed/performed Arousal/Alertness: Awake/alert Orientation Level: Appears intact for tasks assessed Behavior During Session: [facility] for tasks performed    Balance     End of Session PT - End of Session Equipment Utilized During Treatment: Gait belt Activity Tolerance: Patient tolerated treatment well Patient left: in chair;with call bell/phone within reach Nurse Communication: Mobility status   GP     Fredrich Birks 10/24/2012, 10:55 AM  10/24/2012 Fredrich Birks PTA [phone] pager [phone] office

## 2012-10-24 NOTE — Progress Notes (Signed)
[facility]  Patient Status: New  [facility] is providing the following services: RN and MSW  If patient discharges after hours, please call [phone].   Edwin Rodriguez 10/24/2012, 11:09 AM
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
## 2012-10-30 NOTE — ED Provider Notes (Signed)
Medical screening examination/treatment/procedure(s) were performed by non-physician practitioner and as supervising physician I was immediately available for consultation/collaboration.   Dione Booze, MD 10/30/12 [phone]

## 2013-01-16 ENCOUNTER — Encounter (HOSPITAL_COMMUNITY): Payer: Self-pay
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
## 2013-01-17 ENCOUNTER — Encounter (HOSPITAL_COMMUNITY): Payer: Self-pay | Admitting: Anesthesiology

## 2013-01-17 ENCOUNTER — Encounter (HOSPITAL_COMMUNITY)
Admission: RE | Disposition: A | Discharge status: Home Health | Payer: Self-pay | Source: Ambulatory Visit | Attending: Orthopedic Surgery

## 2013-01-17 ENCOUNTER — Ambulatory Visit (HOSPITAL_COMMUNITY): Payer: Medicaid Other

## 2013-01-17 ENCOUNTER — Inpatient Hospital Stay (HOSPITAL_COMMUNITY): Payer: Medicaid Other

## 2013-01-17 ENCOUNTER — Encounter (HOSPITAL_COMMUNITY): Payer: Self-pay | Admitting: Pharmacy Technician

## 2013-01-17 ENCOUNTER — Inpatient Hospital Stay (HOSPITAL_COMMUNITY)
Admission: RE | Admit: 2013-01-17 | Discharge: 2013-01-23 | DRG: 493 | Disposition: A | Discharge status: Home Health | Payer: Medicaid Other | Source: Ambulatory Visit | Attending: Orthopedic Surgery | Admitting: Orthopedic Surgery

## 2013-01-17 DIAGNOSIS — Z86718 Personal history of other venous thrombosis and embolism: Secondary | ICD-10-CM

## 2013-01-17 DIAGNOSIS — M86169 Other acute osteomyelitis, unspecified tibia and fibula: Secondary | ICD-10-CM | POA: Diagnosis present

## 2013-01-17 DIAGNOSIS — I161 Hypertensive emergency: Secondary | ICD-10-CM | POA: Diagnosis present

## 2013-01-17 DIAGNOSIS — Z79899 Other long term (current) drug therapy: Secondary | ICD-10-CM

## 2013-01-17 DIAGNOSIS — J449 Chronic obstructive pulmonary disease, unspecified: Secondary | ICD-10-CM | POA: Diagnosis present

## 2013-01-17 DIAGNOSIS — IMO0002 Reserved for concepts with insufficient information to code with codable children: Principal | ICD-10-CM

## 2013-01-17 DIAGNOSIS — Z4789 Encounter for other orthopedic aftercare: Secondary | ICD-10-CM

## 2013-01-17 DIAGNOSIS — Z91199 Patient's noncompliance with other medical treatment and regimen due to unspecified reason: Secondary | ICD-10-CM

## 2013-01-17 DIAGNOSIS — F1910 Other psychoactive substance abuse, uncomplicated: Secondary | ICD-10-CM | POA: Diagnosis present

## 2013-01-17 DIAGNOSIS — Z9119 Patient's noncompliance with other medical treatment and regimen: Secondary | ICD-10-CM

## 2013-01-17 DIAGNOSIS — I517 Cardiomegaly: Secondary | ICD-10-CM

## 2013-01-17 DIAGNOSIS — S8290XS Unspecified fracture of unspecified lower leg, sequela: Secondary | ICD-10-CM

## 2013-01-17 DIAGNOSIS — S82202A Unspecified fracture of shaft of left tibia, initial encounter for closed fracture: Secondary | ICD-10-CM

## 2013-01-17 DIAGNOSIS — F1010 Alcohol abuse, uncomplicated: Secondary | ICD-10-CM | POA: Diagnosis present

## 2013-01-17 DIAGNOSIS — J4489 Other specified chronic obstructive pulmonary disease: Secondary | ICD-10-CM | POA: Diagnosis present

## 2013-01-17 DIAGNOSIS — Z86711 Personal history of pulmonary embolism: Secondary | ICD-10-CM

## 2013-01-17 DIAGNOSIS — E669 Obesity, unspecified: Secondary | ICD-10-CM | POA: Diagnosis present

## 2013-01-17 DIAGNOSIS — D72819 Decreased white blood cell count, unspecified: Secondary | ICD-10-CM | POA: Diagnosis not present

## 2013-01-17 DIAGNOSIS — I10 Essential (primary) hypertension: Secondary | ICD-10-CM | POA: Diagnosis present

## 2013-01-17 DIAGNOSIS — F1410 Cocaine abuse, uncomplicated: Secondary | ICD-10-CM | POA: Diagnosis present

## 2013-01-17 DIAGNOSIS — F17200 Nicotine dependence, unspecified, uncomplicated: Secondary | ICD-10-CM | POA: Diagnosis present

## 2013-01-17 HISTORY — DX: Chronic kidney disease, unspecified: N18.9

## 2013-01-17 HISTORY — DX: Unspecified osteoarthritis, unspecified site: M19.90

## 2013-01-17 HISTORY — DX: Unspecified fracture of unspecified lower leg, initial encounter for closed fracture: S82.90XA

## 2013-01-17 LAB — CBC WITH DIFFERENTIAL/PLATELET
Lymphocytes Relative: 32 % (ref 12–46)
Lymphs Abs: 1 10*3/uL (ref 0.7–4.0)
MCV: 84.9 fL (ref 78.0–100.0)
Neutrophils Relative %: 49 % (ref 43–77)
Platelets: 198 10*3/uL (ref 150–400)
RBC: 4.69 MIL/uL (ref 4.22–5.81)
WBC: 3 10*3/uL — ABNORMAL LOW (ref 4.0–10.5)

## 2013-01-17 LAB — TYPE AND SCREEN

## 2013-01-17 LAB — SEDIMENTATION RATE: Sed Rate: 22 mm/hr — ABNORMAL HIGH (ref 0–16)

## 2013-01-17 LAB — COMPREHENSIVE METABOLIC PANEL
ALT: 20 U/L (ref 0–53)
Alkaline Phosphatase: 72 U/L (ref 39–117)
CO2: 30 mEq/L (ref 19–32)
Chloride: 106 mEq/L (ref 96–112)
GFR calc Af Amer: >90 mL/min (ref >90)
GFR calc non Af Amer: >90 mL/min (ref >90)
Glucose, Bld: 90 mg/dL (ref 70–99)
Potassium: 4.4 mEq/L (ref 3.5–5.1)
Sodium: 142 mEq/L (ref 135–145)

## 2013-01-17 LAB — ABO/RH: ABO/RH(D): O POS

## 2013-01-17 LAB — URINALYSIS, ROUTINE W REFLEX MICROSCOPIC
Bilirubin Urine: NEGATIVE
Glucose, UA: NEGATIVE mg/dL
Ketones, ur: NEGATIVE mg/dL
Leukocytes, UA: NEGATIVE
pH: 6.5 (ref 5.0–8.0)

## 2013-01-17 LAB — SURGICAL PCR SCREEN
MRSA, PCR: NEGATIVE
Staphylococcus aureus: NEGATIVE

## 2013-01-17 LAB — URINE MICROSCOPIC-ADD ON

## 2013-01-17 SURGERY — CANCELLED PROCEDURE
Site: Leg Lower | Laterality: Left

## 2013-01-17 MED ORDER — LACTATED RINGERS IV SOLN: INTRAVENOUS | Status: DC

## 2013-01-17 MED ORDER — MUPIROCIN 2 % EX OINT
TOPICAL_OINTMENT | CUTANEOUS | Status: AC
  Filled 2013-01-17: qty 22

## 2013-01-17 MED ORDER — CEFAZOLIN SODIUM-DEXTROSE 2-3 GM-% IV SOLR
2.0000 g | Freq: Once | INTRAVENOUS | Status: DC
  Filled 2013-01-17: qty 50

## 2013-01-17 MED ORDER — CEFAZOLIN SODIUM-DEXTROSE 2-3 GM-% IV SOLR
INTRAVENOUS | Status: AC
  Filled 2013-01-17: qty 50

## 2013-01-17 MED ORDER — ACETAMINOPHEN 650 MG RE SUPP: 650.0000 mg | Freq: Four times a day (QID) | RECTAL | Status: DC | PRN

## 2013-01-17 MED ORDER — CEFAZOLIN SODIUM 1-5 GM-% IV SOLN: 1.0000 g | Freq: Once | INTRAVENOUS | Status: DC

## 2013-01-17 MED ORDER — CARVEDILOL 6.25 MG PO TABS
6.2500 mg | ORAL_TABLET | Freq: Once | ORAL | Status: AC
  Administered 2013-01-17: 6.25 mg via ORAL
  Filled 2013-01-17 (×2): qty 1

## 2013-01-17 MED ORDER — OXYCODONE HCL 5 MG PO TABS
5.0000 mg | ORAL_TABLET | ORAL | Status: DC | PRN
  Administered 2013-01-17 – 2013-01-19 (×7): 5 mg via ORAL
  Filled 2013-01-17 (×7): qty 1

## 2013-01-17 MED ORDER — LACTATED RINGERS IV SOLN
INTRAVENOUS | Status: DC
  Administered 2013-01-19 (×3): via INTRAVENOUS

## 2013-01-17 MED ORDER — MUPIROCIN 2 % EX OINT
TOPICAL_OINTMENT | Freq: Once | CUTANEOUS | Status: DC
  Filled 2013-01-17: qty 22

## 2013-01-17 MED ORDER — SORBITOL 70 % SOLN: 30.0000 mL | Freq: Every day | Status: DC | PRN

## 2013-01-17 MED ORDER — OXYCODONE HCL 5 MG PO TABS
ORAL_TABLET | ORAL | Status: AC
  Administered 2013-01-17: 5 mg
  Filled 2013-01-17: qty 1

## 2013-01-17 MED ORDER — CLONIDINE HCL 0.3 MG PO TABS
0.3000 mg | ORAL_TABLET | ORAL | Status: AC
  Administered 2013-01-17: 0.3 mg via ORAL
  Filled 2013-01-17: qty 1

## 2013-01-17 MED ORDER — ACETAMINOPHEN 325 MG PO TABS
650.0000 mg | ORAL_TABLET | Freq: Four times a day (QID) | ORAL | Status: DC | PRN
  Administered 2013-01-17 – 2013-01-18 (×2): 650 mg via ORAL
  Filled 2013-01-17 (×2): qty 2

## 2013-01-17 MED ORDER — DOCUSATE SODIUM 100 MG PO CAPS
100.0000 mg | ORAL_CAPSULE | Freq: Two times a day (BID) | ORAL | Status: DC
  Administered 2013-01-17 – 2013-01-18 (×3): 100 mg via ORAL
  Filled 2013-01-17 (×9): qty 1

## 2013-01-17 MED ORDER — CARVEDILOL 6.25 MG PO TABS
6.2500 mg | ORAL_TABLET | Freq: Two times a day (BID) | ORAL | Status: DC
  Administered 2013-01-17 – 2013-01-23 (×11): 6.25 mg via ORAL
  Filled 2013-01-17 (×15): qty 1

## 2013-01-17 MED ORDER — ALUM & MAG HYDROXIDE-SIMETH 200-200-20 MG/5ML PO SUSP: 30.0000 mL | Freq: Four times a day (QID) | ORAL | Status: DC | PRN

## 2013-01-17 MED ORDER — HYDRALAZINE HCL 20 MG/ML IJ SOLN
10.0000 mg | Freq: Three times a day (TID) | INTRAMUSCULAR | Status: DC | PRN
Start: 2013-01-17 — End: 2013-01-23
  Administered 2013-01-18 – 2013-01-19 (×2): 10 mg via INTRAVENOUS
  Filled 2013-01-17: qty 0.5

## 2013-01-17 MED ORDER — POLYETHYLENE GLYCOL 3350 17 G PO PACK: 17.0000 g | PACK | Freq: Every day | ORAL | Status: DC | PRN

## 2013-01-17 MED ORDER — LISINOPRIL 5 MG PO TABS
5.0000 mg | ORAL_TABLET | Freq: Every day | ORAL | Status: DC
  Administered 2013-01-17 – 2013-01-18 (×2): 5 mg via ORAL
  Filled 2013-01-17 (×2): qty 1

## 2013-01-17 MED ORDER — CLONIDINE HCL 0.3 MG PO TABS
0.3000 mg | ORAL_TABLET | Freq: Two times a day (BID) | ORAL | Status: DC
  Administered 2013-01-17 – 2013-01-23 (×12): 0.3 mg via ORAL
  Filled 2013-01-17 (×15): qty 1

## 2013-01-17 MED ORDER — ADULT MULTIVITAMIN W/MINERALS CH
1.0000 | ORAL_TABLET | Freq: Every day | ORAL | Status: DC
  Administered 2013-01-17 – 2013-01-23 (×6): 1 via ORAL
  Filled 2013-01-17 (×7): qty 1

## 2013-01-17 SURGICAL SUPPLY — 79 items
BANDAGE ELASTIC 4 VELCRO ST LF (GAUZE/BANDAGES/DRESSINGS) ×2 IMPLANT
BANDAGE ELASTIC 6 VELCRO ST LF (GAUZE/BANDAGES/DRESSINGS) ×2 IMPLANT
BANDAGE ESMARK 6X9 LF (GAUZE/BANDAGES/DRESSINGS) ×1 IMPLANT
BANDAGE GAUZE ELAST BULKY 4 IN (GAUZE/BANDAGES/DRESSINGS) ×4 IMPLANT
BLADE SURG 10 STRL SS (BLADE) ×2 IMPLANT
BLADE SURG 15 STRL LF DISP TIS (BLADE) ×1 IMPLANT
BLADE SURG 15 STRL SS (BLADE) ×1
BLADE SURG ROTATE 9660 (MISCELLANEOUS) IMPLANT
BNDG COHESIVE 4X5 TAN STRL (GAUZE/BANDAGES/DRESSINGS) ×2 IMPLANT
BNDG COHESIVE 6X5 TAN STRL LF (GAUZE/BANDAGES/DRESSINGS) ×2 IMPLANT
BNDG ESMARK 6X9 LF (GAUZE/BANDAGES/DRESSINGS) ×2
BRUSH SCRUB DISP (MISCELLANEOUS) ×4 IMPLANT
CLEANER TIP ELECTROSURG 2X2 (MISCELLANEOUS) ×2 IMPLANT
CLOTH BEACON ORANGE TIMEOUT ST (SAFETY) ×2 IMPLANT
COVER MAYO STAND STRL (DRAPES) ×2 IMPLANT
COVER SURGICAL LIGHT HANDLE (MISCELLANEOUS) ×4 IMPLANT
CUFF TOURNIQUET SINGLE 18IN (TOURNIQUET CUFF) IMPLANT
CUFF TOURNIQUET SINGLE 24IN (TOURNIQUET CUFF) IMPLANT
CUFF TOURNIQUET SINGLE 34IN LL (TOURNIQUET CUFF) IMPLANT
DRAPE C-ARM 42X72 X-RAY (DRAPES) ×2 IMPLANT
DRAPE C-ARMOR (DRAPES) ×2 IMPLANT
DRAPE INCISE IOBAN 66X45 STRL (DRAPES) ×2 IMPLANT
DRAPE OEC MINIVIEW 54X84 (DRAPES) ×2 IMPLANT
DRAPE ORTHO SPLIT 77X108 STRL (DRAPES)
DRAPE SURG ORHT 6 SPLT 77X108 (DRAPES) IMPLANT
DRAPE U-SHAPE 47X51 STRL (DRAPES) ×2 IMPLANT
DRSG ADAPTIC 3X8 NADH LF (GAUZE/BANDAGES/DRESSINGS) ×2 IMPLANT
DRSG PAD ABDOMINAL 8X10 ST (GAUZE/BANDAGES/DRESSINGS) ×8 IMPLANT
ELECT REM PT RETURN 9FT ADLT (ELECTROSURGICAL) ×2
ELECTRODE REM PT RTRN 9FT ADLT (ELECTROSURGICAL) ×1 IMPLANT
EVACUATOR 1/8 PVC DRAIN (DRAIN) IMPLANT
EVACUATOR 3/16  PVC DRAIN (DRAIN)
EVACUATOR 3/16 PVC DRAIN (DRAIN) IMPLANT
GLOVE BIO SURGEON STRL SZ7.5 (GLOVE) ×2 IMPLANT
GLOVE BIO SURGEON STRL SZ8 (GLOVE) ×2 IMPLANT
GLOVE BIOGEL PI IND STRL 7.5 (GLOVE) ×1 IMPLANT
GLOVE BIOGEL PI IND STRL 8 (GLOVE) ×1 IMPLANT
GLOVE BIOGEL PI INDICATOR 7.5 (GLOVE) ×1
GLOVE BIOGEL PI INDICATOR 8 (GLOVE) ×1
GOWN PREVENTION PLUS XLARGE (GOWN DISPOSABLE) ×2 IMPLANT
GOWN STRL NON-REIN LRG LVL3 (GOWN DISPOSABLE) ×4 IMPLANT
IMMOBILIZER KNEE 22 UNIV (SOFTGOODS) ×2 IMPLANT
KIT BASIN OR (CUSTOM PROCEDURE TRAY) ×2 IMPLANT
KIT ROOM TURNOVER OR (KITS) ×2 IMPLANT
MANIFOLD NEPTUNE II (INSTRUMENTS) ×2 IMPLANT
NDL SUT .5 MAYO 1.404X.05X (NEEDLE) IMPLANT
NEEDLE 22X1 1/2 (OR ONLY) (NEEDLE) IMPLANT
NEEDLE MAYO TAPER (NEEDLE)
NS IRRIG 1000ML POUR BTL (IV SOLUTION) ×2 IMPLANT
PACK ORTHO EXTREMITY (CUSTOM PROCEDURE TRAY) ×2 IMPLANT
PAD ARMBOARD 7.5X6 YLW CONV (MISCELLANEOUS) ×4 IMPLANT
PAD CAST 4YDX4 CTTN HI CHSV (CAST SUPPLIES) ×1 IMPLANT
PADDING CAST COTTON 4X4 STRL (CAST SUPPLIES) ×1
PADDING CAST COTTON 6X4 STRL (CAST SUPPLIES) ×6 IMPLANT
SPONGE GAUZE 4X4 12PLY (GAUZE/BANDAGES/DRESSINGS) ×2 IMPLANT
SPONGE LAP 18X18 X RAY DECT (DISPOSABLE) ×2 IMPLANT
SPONGE SCRUB IODOPHOR (GAUZE/BANDAGES/DRESSINGS) ×2 IMPLANT
STAPLER VISISTAT 35W (STAPLE) ×2 IMPLANT
STOCKINETTE IMPERVIOUS LG (DRAPES) ×2 IMPLANT
STRIP CLOSURE SKIN 1/2X4 (GAUZE/BANDAGES/DRESSINGS) IMPLANT
SUCTION FRAZIER TIP 10 FR DISP (SUCTIONS) ×2 IMPLANT
SUT ETHILON 3 0 PS 1 (SUTURE) IMPLANT
SUT PDS AB 2-0 CT1 27 (SUTURE) IMPLANT
SUT PROLENE 0 CT 2 (SUTURE) ×4 IMPLANT
SUT VIC AB 0 CT1 27 (SUTURE) ×1
SUT VIC AB 0 CT1 27XBRD ANBCTR (SUTURE) ×1 IMPLANT
SUT VIC AB 1 CT1 27 (SUTURE) ×1
SUT VIC AB 1 CT1 27XBRD ANBCTR (SUTURE) ×1 IMPLANT
SUT VIC AB 2-0 CT1 27 (SUTURE) ×2
SUT VIC AB 2-0 CT1 TAPERPNT 27 (SUTURE) ×2 IMPLANT
SYR 20ML ECCENTRIC (SYRINGE) IMPLANT
SYR CONTROL 10ML LL (SYRINGE) IMPLANT
TOWEL OR 17X24 6PK STRL BLUE (TOWEL DISPOSABLE) ×4 IMPLANT
TOWEL OR 17X26 10 PK STRL BLUE (TOWEL DISPOSABLE) ×4 IMPLANT
TRAY FOLEY CATH 14FR (SET/KITS/TRAYS/PACK) IMPLANT
TUBE CONNECTING 12X1/4 (SUCTIONS) ×2 IMPLANT
UNDERPAD 30X30 INCONTINENT (UNDERPADS AND DIAPERS) ×2 IMPLANT
WATER STERILE IRR 1000ML POUR (IV SOLUTION) ×4 IMPLANT
YANKAUER SUCT BULB TIP NO VENT (SUCTIONS) ×2 IMPLANT

## 2013-01-17 NOTE — Progress Notes (Signed)
Patient given 5mg  oxycodone IR per orders at 1448. Unable to document on [facility]
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
## 2013-01-17 NOTE — Progress Notes (Signed)
Dr. Blake Divine bedside and notified of patient's BP. Patient has not received oral BP meds. Oral BP meds given. Will continue to assess.

## 2013-01-17 NOTE — Progress Notes (Signed)
Report called to Herbert Seta, RN on [place].

## 2013-01-17 NOTE — Progress Notes (Signed)
Orthopaedic Addendum  Upon arrival to short stay pt with BP >200/>100. Pt states that he has not taken meds in about 3 days. States he is otherwise compliant.  Denies any additional complaints.  Additionally, despite numerous conversations with the pt about establishing care with a PCP, he has yet to do so  Suspect some rebound from inconsistent use of clonidine but also think pt largely noncompliant with meds  At this time we will cancel surgery, admit for BP control and hopefully repost for Thursday or Friday   Medicine consult to assist with BP management  Hx of PSA (cocaine), drug screen pending  Mearl Latin, PA-C [facility] [phone] (P) 01/17/2013 12:09 PM

## 2013-01-17 NOTE — H&P (Signed)
I have seen and examined the patient. I agree with the findings above.  I discussed with the patient the risks and benefits of surgery, including the possibility of infection, nerve injury, vessel injury, wound breakdown, arthritis, symptomatic hardware, DVT/ PE, loss of motion, and need for further surgery among others.  We also specifically discussed the elevated risk of soft tissue breakdown that could lead to amputation.  He understood these risks and wished to proceed.  Budd Palmer, MD 01/17/2013 11:03 AM

## 2013-01-18 DIAGNOSIS — I517 Cardiomegaly: Secondary | ICD-10-CM

## 2013-01-18 LAB — RAPID URINE DRUG SCREEN, HOSP PERFORMED
Amphetamines: NOT DETECTED
Barbiturates: NOT DETECTED

## 2013-01-18 MED ORDER — LISINOPRIL 10 MG PO TABS
10.0000 mg | ORAL_TABLET | Freq: Every day | ORAL | Status: DC
  Administered 2013-01-19 – 2013-01-23 (×5): 10 mg via ORAL
  Filled 2013-01-18 (×6): qty 1

## 2013-01-18 MED ORDER — HYDROMORPHONE HCL PF 1 MG/ML IJ SOLN
1.0000 mg | INTRAMUSCULAR | Status: DC | PRN
  Administered 2013-01-19 – 2013-01-21 (×2): 1 mg via INTRAVENOUS
  Filled 2013-01-18: qty 1

## 2013-01-18 MED ORDER — OXYCODONE-ACETAMINOPHEN 5-325 MG PO TABS
1.0000 | ORAL_TABLET | Freq: Four times a day (QID) | ORAL | Status: DC | PRN
  Administered 2013-01-18 – 2013-01-19 (×4): 2 via ORAL
  Filled 2013-01-18 (×3): qty 2

## 2013-01-18 MED ORDER — AMLODIPINE BESYLATE 10 MG PO TABS
10.0000 mg | ORAL_TABLET | Freq: Every day | ORAL | Status: DC
  Administered 2013-01-18 – 2013-01-22 (×5): 10 mg via ORAL
  Filled 2013-01-18 (×7): qty 1

## 2013-01-18 MED ORDER — CEFAZOLIN SODIUM-DEXTROSE 2-3 GM-% IV SOLR
2.0000 g | Freq: Once | INTRAVENOUS | Status: DC
  Filled 2013-01-18: qty 50

## 2013-01-18 NOTE — Progress Notes (Signed)
[facility] PROGRESS NOTE  Edwin Rodriguez ZOX:[id number] DOB: [birth year] DOA: 01/17/2013 PCP: No primary provider on file.  Assessment/Plan: 1. HTN - severe uncontrolled - suspect due to pain on top of severe HTN . To continue home meds. Added intravenous analgesics. Also we did increase the dose of acei and added norvasc 10 mg QHS on 01/18/13    Code Status: full  Family Communication: patient  Disposition Plan: per primary team    HPI/Subjective:   Objective: Filed Vitals:   01/17/13 1717 01/17/13 2339 01/18/13 0830 01/18/13 0833  BP: 175/103 173/121 167/106 158/98  Pulse: 78 70 61 59  Temp: 98.5 F (36.9 C) 98.7 F (37.1 C)    Resp: 16 18    Height:      Weight:      SpO2: 100% 100%      Intake/Output Summary (Last 24 hours) at 01/18/13 1514 Last data filed at 01/17/13 1700  Gross per 24 hour  Intake    480 ml  Output    250 ml  Net    230 ml   Filed Weights   01/16/13 1633  Weight: 113.399 kg (250 lb)    Exam:   General:  axox3  Cardiovascular: rrr  Respiratory: ctab   Abdomen: soft, nt   Data Reviewed: Basic Metabolic Panel:  Recent Labs Lab 01/17/13 1050  NA 142  K 4.4  CL 106  CO2 30  GLUCOSE 90  BUN 11  CREATININE 0.93  CALCIUM 9.9   Liver Function Tests:  Recent Labs Lab 01/17/13 1050  AST 16  ALT 20  ALKPHOS 72  BILITOT 0.3  PROT 7.7  ALBUMIN 3.7   No results found for this basename: LIPASE, AMYLASE,  in the last 168 hours No results found for this basename: AMMONIA,  in the last 168 hours CBC:  Recent Labs Lab 01/17/13 1050  WBC 3.0*  NEUTROABS 1.4*  HGB 14.0  HCT 39.8  MCV 84.9  PLT 198   Cardiac Enzymes: No results found for this basename: CKTOTAL, CKMB, CKMBINDEX, TROPONINI,  in the last 168 hours BNP (last 3 results) No results found for this basename: PROBNP,  in the last 8760 hours CBG: No results found for this basename: GLUCAP,  in the last 168 hours  Recent Results (from the past 240  hour(s))  SURGICAL PCR SCREEN     Status: None   Collection Time    01/17/13 10:43 AM      Result Value Range Status   MRSA, PCR NEGATIVE  NEGATIVE Final   Staphylococcus aureus NEGATIVE  NEGATIVE Final   Comment:            The Xpert SA Assay (FDA     approved for NASAL specimens     in patients over 84 years of age),     is one component of     a comprehensive surveillance     program.  Test performance has     been validated by [employer] for patients greater     than or equal to 82 year old.     It is not intended     to diagnose infection nor to     guide or monitor treatment.     Studies: No results found.  Scheduled Meds: . amLODipine  10 mg Oral QHS  . carvedilol  6.25 mg Oral BID WC  . [START ON 01/19/2013]  ceFAZolin (ANCEF) IV  2 g Intravenous Once  .  cloNIDine  0.3 mg Oral BID  . docusate sodium  100 mg Oral BID  . [START ON 01/19/2013] lisinopril  10 mg Oral Daily  . multivitamin with minerals  1 tablet Oral Daily  . mupirocin ointment   Nasal Once   Continuous Infusions: . lactated ringers      Active Problems:   Severe uncontrolled hypertension   Left pilon fracture   Nonunion of fracture     Edwin Rodriguez  [facility] Pager [phone]. If 8PM-8AM, please contact night-coverage at [email or web address], password [facility] 01/18/2013, 3:14 PM  LOS: 1 day

## 2013-01-18 NOTE — Clinical Social Work Note (Signed)
CSW received consult re: PCP list for [place]. CSW contacted RNCM to complete referral. RNCM stated patient does not have insurance and will find a list according to needs. CSW signing off, no other psychosocial concerns identified. Please re-consult as needed.  Lia Foyer, LCSWA [facility] Clinical Social Worker Contact #: [phone]

## 2013-01-18 NOTE — Progress Notes (Signed)
[facility]  Subjective:  Doing ok No h/a or dizziness Pain tolerable   Pressures still elevated but slowly improving  L arm 167/106  R arm 156/98  HR in 60's    Objective: Vital signs in last 24 hours: Temp:  [98.5 F (36.9 C)-98.7 F (37.1 C)] 98.7 F (37.1 C) (02/25 2339) Pulse Rate:  [70-78] 70 (02/25 2339) Resp:  [16-18] 18 (02/25 2339) BP: (173-175)/(103-121) 173/121 mmHg (02/25 2339) SpO2:  [100 %] 100 % (02/25 2339)  Intake/Output from previous day: 02/25 0701 - 02/26 0700 In: 480 [P.O.:480] Out: 250 [Urine:250] Intake/Output this shift: Intake/Output     02/25 0701 - 02/26 0700 02/26 0701 - 02/27 0700   P.O. 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 HTN urgency/Uncontrolled HTN  Appreciate medicine assistance  Continue per IM   Pt needs to establish care with PCP, will get SW involved pt lives in [place]  3. Pain  Continue with current regimen 4. DVT/PE prophylaxis  Mobilize  Does not need to wear foot pumps 5. FEN  Heart healthy   NPO after MN 6. misc  Urine tox screen pending, lab did not run yesterday  TSH 0.766 uIU/mL 7. Dispo  Possible OR tomorrow vs friday   Mearl Latin, PA-C [facility] [phone] (P) 01/18/2013, 8:29 AM

## 2013-01-19 ENCOUNTER — Encounter (HOSPITAL_COMMUNITY): Payer: Self-pay | Admitting: Certified Registered"

## 2013-01-19 ENCOUNTER — Inpatient Hospital Stay (HOSPITAL_COMMUNITY): Payer: Medicaid Other | Admitting: Certified Registered"

## 2013-01-19 ENCOUNTER — Encounter (HOSPITAL_COMMUNITY)
Admission: RE | Disposition: A | Discharge status: Home Health | Payer: Self-pay | Source: Ambulatory Visit | Attending: Orthopedic Surgery

## 2013-01-19 ENCOUNTER — Inpatient Hospital Stay (HOSPITAL_COMMUNITY): Payer: Medicaid Other

## 2013-01-19 DIAGNOSIS — F1010 Alcohol abuse, uncomplicated: Secondary | ICD-10-CM

## 2013-01-19 DIAGNOSIS — F1410 Cocaine abuse, uncomplicated: Secondary | ICD-10-CM

## 2013-01-19 HISTORY — PX: ORIF ANKLE FRACTURE: SHX5408

## 2013-01-19 HISTORY — PX: EXTERNAL FIXATION REMOVAL: SHX5040

## 2013-01-19 LAB — GRAM STAIN

## 2013-01-19 SURGERY — REMOVAL, EXTERNAL FIXATION DEVICE, LOWER EXTREMITY
Anesthesia: General | Site: Leg Lower | Laterality: Left | Wound class: Clean

## 2013-01-19 MED ORDER — HYDROMORPHONE HCL PF 1 MG/ML IJ SOLN
INTRAMUSCULAR | Status: DC | PRN
  Administered 2013-01-19 (×4): 0.5 mg via INTRAVENOUS

## 2013-01-19 MED ORDER — METOCLOPRAMIDE HCL 5 MG/ML IJ SOLN: 5.0000 mg | Freq: Three times a day (TID) | INTRAMUSCULAR | Status: DC | PRN

## 2013-01-19 MED ORDER — MIDAZOLAM HCL 5 MG/5ML IJ SOLN
INTRAMUSCULAR | Status: DC | PRN
  Administered 2013-01-19 (×2): 1 mg via INTRAVENOUS

## 2013-01-19 MED ORDER — 0.9 % SODIUM CHLORIDE (POUR BTL) OPTIME
TOPICAL | Status: DC | PRN
  Administered 2013-01-19: 2000 mL

## 2013-01-19 MED ORDER — CEFAZOLIN SODIUM-DEXTROSE 2-3 GM-% IV SOLR
INTRAVENOUS | Status: AC
  Administered 2013-01-19: 2 g via INTRAVENOUS
  Filled 2013-01-19: qty 50

## 2013-01-19 MED ORDER — DIPHENHYDRAMINE HCL 12.5 MG/5ML PO ELIX
12.5000 mg | ORAL_SOLUTION | Freq: Four times a day (QID) | ORAL | Status: DC | PRN
  Administered 2013-01-19 – 2013-01-20 (×2): 12.5 mg via ORAL
  Filled 2013-01-19 (×2): qty 10

## 2013-01-19 MED ORDER — NEOSTIGMINE METHYLSULFATE 1 MG/ML IJ SOLN
INTRAMUSCULAR | Status: DC | PRN
  Administered 2013-01-19: 4 mg via INTRAVENOUS

## 2013-01-19 MED ORDER — POTASSIUM CHLORIDE IN NACL 20-0.9 MEQ/L-% IV SOLN
INTRAVENOUS | Status: DC
  Administered 2013-01-20: 16:00:00 via INTRAVENOUS
  Filled 2013-01-19 (×3): qty 1000

## 2013-01-19 MED ORDER — ONDANSETRON HCL 4 MG/2ML IJ SOLN
INTRAMUSCULAR | Status: DC | PRN
  Administered 2013-01-19: 4 mg via INTRAVENOUS

## 2013-01-19 MED ORDER — HYDROMORPHONE HCL PF 1 MG/ML IJ SOLN: 0.2500 mg | INTRAMUSCULAR | Status: DC | PRN

## 2013-01-19 MED ORDER — SORBITOL 70 % SOLN
30.0000 mL | Freq: Every day | Status: DC | PRN
  Administered 2013-01-21: 30 mL via ORAL
  Filled 2013-01-19: qty 30

## 2013-01-19 MED ORDER — DIPHENHYDRAMINE HCL 50 MG/ML IJ SOLN: 12.5000 mg | Freq: Four times a day (QID) | INTRAMUSCULAR | Status: DC | PRN

## 2013-01-19 MED ORDER — ROCURONIUM BROMIDE 100 MG/10ML IV SOLN
INTRAVENOUS | Status: DC | PRN
  Administered 2013-01-19 (×4): 10 mg via INTRAVENOUS
  Administered 2013-01-19: 50 mg via INTRAVENOUS
  Administered 2013-01-19: 10 mg via INTRAVENOUS

## 2013-01-19 MED ORDER — MAGNESIUM CITRATE PO SOLN
1.0000 | Freq: Once | ORAL | Status: AC | PRN
  Filled 2013-01-19: qty 296

## 2013-01-19 MED ORDER — METOCLOPRAMIDE HCL 10 MG PO TABS: 5.0000 mg | ORAL_TABLET | Freq: Three times a day (TID) | ORAL | Status: DC | PRN

## 2013-01-19 MED ORDER — HYDROMORPHONE 0.3 MG/ML IV SOLN
INTRAVENOUS | Status: DC
  Administered 2013-01-19: 23:00:00 via INTRAVENOUS
  Administered 2013-01-19: 1.2 mg via INTRAVENOUS
  Administered 2013-01-20: 3 mg via INTRAVENOUS
  Administered 2013-01-20: 2.4 mg via INTRAVENOUS
  Administered 2013-01-20: 4.7 mg via INTRAVENOUS
  Filled 2013-01-19 (×2): qty 25

## 2013-01-19 MED ORDER — HYDROMORPHONE HCL PF 1 MG/ML IJ SOLN
INTRAMUSCULAR | Status: AC
  Filled 2013-01-19: qty 1

## 2013-01-19 MED ORDER — SODIUM CHLORIDE 0.9 % IJ SOLN: 9.0000 mL | INTRAMUSCULAR | Status: DC | PRN

## 2013-01-19 MED ORDER — ENOXAPARIN SODIUM 40 MG/0.4ML ~~LOC~~ SOLN
40.0000 mg | SUBCUTANEOUS | Status: DC
  Administered 2013-01-20 – 2013-01-23 (×4): 40 mg via SUBCUTANEOUS
  Filled 2013-01-19 (×6): qty 0.4

## 2013-01-19 MED ORDER — FENTANYL CITRATE 0.05 MG/ML IJ SOLN
INTRAMUSCULAR | Status: DC | PRN
  Administered 2013-01-19: 100 ug via INTRAVENOUS
  Administered 2013-01-19 (×7): 50 ug via INTRAVENOUS
  Administered 2013-01-19: 100 ug via INTRAVENOUS

## 2013-01-19 MED ORDER — ONDANSETRON HCL 4 MG PO TABS: 4.0000 mg | ORAL_TABLET | Freq: Four times a day (QID) | ORAL | Status: DC | PRN

## 2013-01-19 MED ORDER — HYDRALAZINE HCL 20 MG/ML IJ SOLN
INTRAMUSCULAR | Status: AC
  Filled 2013-01-19: qty 1

## 2013-01-19 MED ORDER — METHOCARBAMOL 500 MG PO TABS
ORAL_TABLET | ORAL | Status: AC
  Filled 2013-01-19: qty 2

## 2013-01-19 MED ORDER — POLYETHYLENE GLYCOL 3350 17 G PO PACK
17.0000 g | PACK | Freq: Every day | ORAL | Status: DC
  Administered 2013-01-20 – 2013-01-22 (×3): 17 g via ORAL
  Filled 2013-01-19 (×5): qty 1

## 2013-01-19 MED ORDER — LABETALOL HCL 5 MG/ML IV SOLN
INTRAVENOUS | Status: DC | PRN
  Administered 2013-01-19 (×2): 10 mg via INTRAVENOUS

## 2013-01-19 MED ORDER — PROMETHAZINE HCL 25 MG/ML IJ SOLN: 6.2500 mg | INTRAMUSCULAR | Status: DC | PRN

## 2013-01-19 MED ORDER — METHOCARBAMOL 500 MG PO TABS
500.0000 mg | ORAL_TABLET | Freq: Four times a day (QID) | ORAL | Status: DC | PRN
  Administered 2013-01-19: 1000 mg via ORAL
  Administered 2013-01-20 – 2013-01-21 (×2): 500 mg via ORAL
  Administered 2013-01-21 (×2): 1000 mg via ORAL
  Administered 2013-01-22 (×3): 500 mg via ORAL
  Administered 2013-01-23: 1000 mg via ORAL
  Filled 2013-01-19: qty 1
  Filled 2013-01-19: qty 2
  Filled 2013-01-19 (×3): qty 1
  Filled 2013-01-19 (×2): qty 2
  Filled 2013-01-19: qty 1

## 2013-01-19 MED ORDER — GLYCOPYRROLATE 0.2 MG/ML IJ SOLN
INTRAMUSCULAR | Status: DC | PRN
  Administered 2013-01-19: 0.6 mg via INTRAVENOUS

## 2013-01-19 MED ORDER — ONDANSETRON HCL 4 MG/2ML IJ SOLN: 4.0000 mg | Freq: Four times a day (QID) | INTRAMUSCULAR | Status: DC | PRN

## 2013-01-19 MED ORDER — OXYCODONE-ACETAMINOPHEN 5-325 MG PO TABS
ORAL_TABLET | ORAL | Status: AC
  Filled 2013-01-19: qty 2

## 2013-01-19 MED ORDER — LIDOCAINE HCL (CARDIAC) 20 MG/ML IV SOLN
INTRAVENOUS | Status: DC | PRN
  Administered 2013-01-19: 100 mg via INTRAVENOUS

## 2013-01-19 MED ORDER — PROPOFOL 10 MG/ML IV BOLUS
INTRAVENOUS | Status: DC | PRN
  Administered 2013-01-19: 150 mg via INTRAVENOUS

## 2013-01-19 MED ORDER — NALOXONE HCL 0.4 MG/ML IJ SOLN: 0.4000 mg | INTRAMUSCULAR | Status: DC | PRN

## 2013-01-19 MED ORDER — HYDROMORPHONE 0.3 MG/ML IV SOLN
INTRAVENOUS | Status: AC
  Filled 2013-01-19: qty 25

## 2013-01-19 MED ORDER — CEFAZOLIN SODIUM 1-5 GM-% IV SOLN
1.0000 g | Freq: Three times a day (TID) | INTRAVENOUS | Status: AC
  Administered 2013-01-19 – 2013-01-20 (×3): 1 g via INTRAVENOUS
  Filled 2013-01-19 (×3): qty 50

## 2013-01-19 MED ORDER — DOCUSATE SODIUM 100 MG PO CAPS
100.0000 mg | ORAL_CAPSULE | Freq: Two times a day (BID) | ORAL | Status: DC
  Administered 2013-01-19 – 2013-01-23 (×8): 100 mg via ORAL
  Filled 2013-01-19 (×8): qty 1

## 2013-01-19 MED ORDER — METHOCARBAMOL 100 MG/ML IJ SOLN
500.0000 mg | Freq: Four times a day (QID) | INTRAVENOUS | Status: DC | PRN
  Filled 2013-01-19: qty 5

## 2013-01-19 SURGICAL SUPPLY — 91 items
BANDAGE ELASTIC 4 VELCRO ST LF (GAUZE/BANDAGES/DRESSINGS) ×4 IMPLANT
BANDAGE ELASTIC 6 VELCRO ST LF (GAUZE/BANDAGES/DRESSINGS) ×2 IMPLANT
BANDAGE ESMARK 6X9 LF (GAUZE/BANDAGES/DRESSINGS) ×1 IMPLANT
BANDAGE GAUZE ELAST BULKY 4 IN (GAUZE/BANDAGES/DRESSINGS) ×4 IMPLANT
BIT DRILL 2.5X2.75 QC CALB (BIT) ×2 IMPLANT
BIT DRILL CALIBRATED 2.7 (BIT) ×2 IMPLANT
BLADE SURG 10 STRL SS (BLADE) ×2 IMPLANT
BNDG COHESIVE 4X5 TAN STRL (GAUZE/BANDAGES/DRESSINGS) IMPLANT
BNDG COHESIVE 6X5 TAN STRL LF (GAUZE/BANDAGES/DRESSINGS) IMPLANT
BNDG ESMARK 6X9 LF (GAUZE/BANDAGES/DRESSINGS) ×2
BONE CHIP PRESERV 20CC (Bone Implant) ×2 IMPLANT
BRUSH SCRUB DISP (MISCELLANEOUS) ×4 IMPLANT
CLEANER TIP ELECTROSURG 2X2 (MISCELLANEOUS) IMPLANT
CLOTH BEACON ORANGE TIMEOUT ST (SAFETY) ×2 IMPLANT
COVER SURGICAL LIGHT HANDLE (MISCELLANEOUS) ×4 IMPLANT
CUFF TOURNIQUET SINGLE 18IN (TOURNIQUET CUFF) IMPLANT
CUFF TOURNIQUET SINGLE 24IN (TOURNIQUET CUFF) IMPLANT
CUFF TOURNIQUET SINGLE 34IN LL (TOURNIQUET CUFF) ×2 IMPLANT
DRAPE C-ARM 42X72 X-RAY (DRAPES) IMPLANT
DRAPE C-ARMOR (DRAPES) ×4 IMPLANT
DRAPE INCISE IOBAN 66X45 STRL (DRAPES) ×4 IMPLANT
DRAPE OEC MINIVIEW 54X84 (DRAPES) IMPLANT
DRAPE ORTHO SPLIT 77X108 STRL (DRAPES) ×3
DRAPE PROXIMA HALF (DRAPES) ×2 IMPLANT
DRAPE SURG ORHT 6 SPLT 77X108 (DRAPES) ×3 IMPLANT
DRAPE U-SHAPE 47X51 STRL (DRAPES) ×2 IMPLANT
DRSG ADAPTIC 3X8 NADH LF (GAUZE/BANDAGES/DRESSINGS) ×4 IMPLANT
DRSG EMULSION OIL 3X3 NADH (GAUZE/BANDAGES/DRESSINGS) IMPLANT
ELECT CAUTERY BLADE 6.4 (BLADE) ×2 IMPLANT
ELECT REM PT RETURN 9FT ADLT (ELECTROSURGICAL) ×2
ELECTRODE REM PT RTRN 9FT ADLT (ELECTROSURGICAL) ×1 IMPLANT
EVACUATOR 1/8 PVC DRAIN (DRAIN) IMPLANT
GLOVE BIO SURGEON STRL SZ7.5 (GLOVE) ×2 IMPLANT
GLOVE BIO SURGEON STRL SZ8 (GLOVE) ×6 IMPLANT
GLOVE BIOGEL PI IND STRL 7.5 (GLOVE) ×1 IMPLANT
GLOVE BIOGEL PI IND STRL 8 (GLOVE) ×3 IMPLANT
GLOVE BIOGEL PI INDICATOR 7.5 (GLOVE) ×1
GLOVE BIOGEL PI INDICATOR 8 (GLOVE) ×3
GLOVE SURG SS PI 8.0 STRL IVOR (GLOVE) ×2 IMPLANT
GOWN PREVENTION PLUS XLARGE (GOWN DISPOSABLE) ×6 IMPLANT
GOWN STRL NON-REIN LRG LVL3 (GOWN DISPOSABLE) ×4 IMPLANT
K-WIRE ACE 1.6X6 (WIRE) ×2
KIT BASIN OR (CUSTOM PROCEDURE TRAY) ×2 IMPLANT
KIT INFUSE LRG II (Orthopedic Implant) ×2 IMPLANT
KIT ROOM TURNOVER OR (KITS) ×2 IMPLANT
KWIRE ACE 1.6X6 (WIRE) ×1 IMPLANT
MANIFOLD NEPTUNE II (INSTRUMENTS) IMPLANT
NEEDLE 22X1 1/2 (OR ONLY) (NEEDLE) IMPLANT
NEEDLE HYPO 21X1.5 SAFETY (NEEDLE) IMPLANT
NS IRRIG 1000ML POUR BTL (IV SOLUTION) ×4 IMPLANT
PACK GENERAL/GYN (CUSTOM PROCEDURE TRAY) IMPLANT
PACK ORTHO EXTREMITY (CUSTOM PROCEDURE TRAY) ×2 IMPLANT
PAD ARMBOARD 7.5X6 YLW CONV (MISCELLANEOUS) ×4 IMPLANT
PAD CAST 4YDX4 CTTN HI CHSV (CAST SUPPLIES) IMPLANT
PADDING CAST COTTON 4X4 STRL (CAST SUPPLIES)
PADDING CAST COTTON 6X4 STRL (CAST SUPPLIES) ×4 IMPLANT
PENCIL BUTTON HOLSTER BLD 10FT (ELECTRODE) ×2 IMPLANT
PLATE 15H LT DIST ANTLAT TIB (Plate) ×2 IMPLANT
SCREW CORTICAL 3.5MM  30MM (Screw) ×1 IMPLANT
SCREW CORTICAL 3.5MM  46MM (Screw) ×1 IMPLANT
SCREW CORTICAL 3.5MM 30MM (Screw) ×1 IMPLANT
SCREW CORTICAL 3.5MM 36MM (Screw) ×2 IMPLANT
SCREW CORTICAL 3.5MM 38MM (Screw) ×2 IMPLANT
SCREW CORTICAL 3.5MM 46MM (Screw) ×1 IMPLANT
SCREW LOCK CORT STAR 3.5X28 (Screw) ×2 IMPLANT
SCREW LOCK CORT STAR 3.5X44 (Screw) ×2 IMPLANT
SCREW LOCK CORT STAR 3.5X48 (Screw) ×2 IMPLANT
SPONGE GAUZE 4X4 12PLY (GAUZE/BANDAGES/DRESSINGS) ×2 IMPLANT
SPONGE LAP 18X18 X RAY DECT (DISPOSABLE) ×6 IMPLANT
SPONGE SCRUB IODOPHOR (GAUZE/BANDAGES/DRESSINGS) ×2 IMPLANT
STAPLER VISISTAT 35W (STAPLE) IMPLANT
STOCKINETTE IMPERVIOUS LG (DRAPES) IMPLANT
STRIP CLOSURE SKIN 1/2X4 (GAUZE/BANDAGES/DRESSINGS) IMPLANT
SUCTION FRAZIER TIP 10 FR DISP (SUCTIONS) ×2 IMPLANT
SUT ETHILON 2 0 FS 18 (SUTURE) IMPLANT
SUT ETHILON 3 0 PS 1 (SUTURE) ×4 IMPLANT
SUT ETHILON O TP 1 (SUTURE) ×2 IMPLANT
SUT PDS AB 2-0 CT1 27 (SUTURE) IMPLANT
SUT VIC AB 0 CT1 27 (SUTURE) ×2
SUT VIC AB 0 CT1 27XBRD ANBCTR (SUTURE) ×2 IMPLANT
SUT VIC AB 2-0 CT1 27 (SUTURE) ×3
SUT VIC AB 2-0 CT1 TAPERPNT 27 (SUTURE) ×3 IMPLANT
SUT VIC AB 2-0 CT3 27 (SUTURE) IMPLANT
SYR BULB 3OZ (MISCELLANEOUS) ×2 IMPLANT
SYR CONTROL 10ML LL (SYRINGE) IMPLANT
TOWEL OR 17X24 6PK STRL BLUE (TOWEL DISPOSABLE) ×4 IMPLANT
TOWEL OR 17X26 10 PK STRL BLUE (TOWEL DISPOSABLE) ×4 IMPLANT
TUBE CONNECTING 12X1/4 (SUCTIONS) ×4 IMPLANT
UNDERPAD 30X30 INCONTINENT (UNDERPADS AND DIAPERS) ×4 IMPLANT
WATER STERILE IRR 1000ML POUR (IV SOLUTION) IMPLANT
YANKAUER SUCT BULB TIP NO VENT (SUCTIONS) ×4 IMPLANT

## 2013-01-19 NOTE — Progress Notes (Signed)
UR COMPLETED  

## 2013-01-19 NOTE — Preoperative (Signed)
Beta Blockers   Reason not to administer Beta Blockers:Not Applicable 

## 2013-01-19 NOTE — Anesthesia Postprocedure Evaluation (Signed)
  Anesthesia Post-op Note  Patient: Edwin Rodriguez  Procedure(s) Performed: Procedure(s) with comments: REMOVAL EXTERNAL FIXATION LEG (Left) OPEN REDUCTION INTERNAL FIXATION (ORIF) PILON  (Left) - Repair of tibial shaft non union left; Partial excision of bone for osteomyelitis.  Patient Location: PACU  Anesthesia Type:General  Level of Consciousness: awake  Airway and Oxygen Therapy: Patient Spontanous Breathing  Post-op Pain: mild  Post-op Assessment: Post-op Vital signs reviewed  Post-op Vital Signs: stable  Complications: No apparent anesthesia complications

## 2013-01-19 NOTE — Transfer of Care (Signed)
Immediate Anesthesia Transfer of Care Note  Patient: Edwin Rodriguez  Procedure(s) Performed: Procedure(s) with comments: REMOVAL EXTERNAL FIXATION LEG (Left) OPEN REDUCTION INTERNAL FIXATION (ORIF) PILON  (Left) - Repair of tibial shaft non union left; Partial excision of bone for osteomyelitis.  Patient Location: PACU  Anesthesia Type:General  Level of Consciousness: awake  Airway & Oxygen Therapy: Patient Spontanous Breathing and Patient connected to nasal cannula oxygen  Post-op Assessment: Report given to PACU RN, Post -op Vital signs reviewed and stable and Patient moving all extremities  Post vital signs: Reviewed and stable  Complications: No apparent anesthesia complications

## 2013-01-19 NOTE — Anesthesia Preprocedure Evaluation (Addendum)
Anesthesia Evaluation  Patient identified by MRN, date of birth, ID band Patient awake    Reviewed: Allergy & Precautions, H&P , NPO status , Patient's Chart, lab work & pertinent test results  Airway Mallampati: II TM Distance: >3 FB Neck ROM: Full    Dental  (+) Missing and Dental Advisory Given   Pulmonary COPDCurrent Smoker,  + rhonchi         Cardiovascular hypertension, Rhythm:Regular Rate:Normal     Neuro/Psych    GI/Hepatic (+)     substance abuse  alcohol use and cocaine use,   Endo/Other    Renal/GU      Musculoskeletal   Abdominal (+) + obese,   Peds  Hematology   Anesthesia Other Findings   Reproductive/Obstetrics                          Anesthesia Physical Anesthesia Plan  ASA: III  Anesthesia Plan: General   Post-op Pain Management:    Induction: Intravenous  Airway Management Planned: Oral ETT  Additional Equipment:   Intra-op Plan:   Post-operative Plan: Extubation in OR  Informed Consent: I have reviewed the patients History and Physical, chart, labs and discussed the procedure including the risks, benefits and alternatives for the proposed anesthesia with the patient or authorized representative who has indicated his/her understanding and acceptance.     Plan Discussed with: CRNA and Surgeon  Anesthesia Plan Comments:         Anesthesia Quick Evaluation

## 2013-01-19 NOTE — Progress Notes (Signed)
[facility] PROGRESS NOTE  Edwin Rodriguez [id number]:[id number] DOB: 30-Jul-1971 DOA: 01/17/2013 PCP: No primary provider on file.  Assessment/Plan: 1. HTN - severe uncontrolled - suspect due to pain on top of severe HTN . To continue home meds. Added intravenous analgesics on 01/19/13. Also we did increase the dose of acei and added norvasc 10 mg QHS on 01/18/13    Code Status: full  Family Communication: patient  Disposition Plan: per primary team    HPI/Subjective: No complains   Objective: Filed Vitals:   01/18/13 2034 01/19/13 0212 01/19/13 0550 01/19/13 0605  BP: 150/100 148/96 161/100 152/99  Pulse: 100  98 91  Temp: 98.1 F (36.7 C)  98.2 F (36.8 C)   TempSrc: Oral  Oral   Resp: 18  18   Height:      Weight:      SpO2: 100%  100%    Patient Vitals for the past 24 hrs:  BP Temp Temp src Pulse Resp SpO2  01/19/13 0605 152/99 mmHg - - 91 - -  01/19/13 0550 161/100 mmHg 98.2 F (36.8 C) Oral 98 18 100 %  01/19/13 0212 148/96 mmHg - - - - -  01/18/13 2034 150/100 mmHg 98.1 F (36.7 C) Oral 100 18 100 %  01/18/13 1731 161/95 mmHg - - - - -  01/18/13 1730 164/97 mmHg - - - - -  01/18/13 1656 162/106 mmHg - - 54 - -  01/18/13 1654 175/114 mmHg - - 59 - -  01/18/13 1400 140/92 mmHg 98.1 F (36.7 C) - 57 18 100 %  01/18/13 0833 158/98 mmHg - - 59 - -  01/18/13 0830 167/106 mmHg - - 61 - -     Intake/Output Summary (Last 24 hours) at 01/19/13 0742 Last data filed at 01/19/13 0551  Gross per 24 hour  Intake   1080 ml  Output   1350 ml  Net   -270 ml   Filed Weights   01/16/13 1633  Weight: 113.399 kg (250 lb)    Exam:   General:  axox3  Cardiovascular: rrr  Respiratory: ctab   Abdomen: soft, nt   Data Reviewed: Basic Metabolic Panel:  Recent Labs Lab 01/17/13 1050  NA 142  K 4.4  CL 106  CO2 30  GLUCOSE 90  BUN 11  CREATININE 0.93  CALCIUM 9.9   Liver Function Tests:  Recent Labs Lab 01/17/13 1050  AST 16  ALT 20  ALKPHOS 72   BILITOT 0.3  PROT 7.7  ALBUMIN 3.7   No results found for this basename: LIPASE, AMYLASE,  in the last 168 hours No results found for this basename: AMMONIA,  in the last 168 hours CBC:  Recent Labs Lab 01/17/13 1050  WBC 3.0*  NEUTROABS 1.4*  HGB 14.0  HCT 39.8  MCV 84.9  PLT 198   Cardiac Enzymes: No results found for this basename: CKTOTAL, CKMB, CKMBINDEX, TROPONINI,  in the last 168 hours BNP (last 3 results) No results found for this basename: PROBNP,  in the last 8760 hours CBG: No results found for this basename: GLUCAP,  in the last 168 hours  Recent Results (from the past 240 hour(s))  SURGICAL PCR SCREEN     Status: None   Collection Time    01/17/13 10:43 AM      Result Value Range Status   MRSA, PCR NEGATIVE  NEGATIVE Final   Staphylococcus aureus NEGATIVE  NEGATIVE Final   Comment:  The Xpert SA Assay (FDA     approved for NASAL specimens     in patients over 61 years of age),     is one component of     a comprehensive surveillance     program.  Test performance has     been validated by [employer] for patients greater     than or equal to 60 year old.     It is not intended     to diagnose infection nor to     guide or monitor treatment.     Studies: No results found.  Scheduled Meds: . amLODipine  10 mg Oral QHS  . carvedilol  6.25 mg Oral BID WC  .  ceFAZolin (ANCEF) IV  2 g Intravenous Once  . cloNIDine  0.3 mg Oral BID  . docusate sodium  100 mg Oral BID  . lisinopril  10 mg Oral Daily  . multivitamin with minerals  1 tablet Oral Daily  . mupirocin ointment   Nasal Once   Continuous Infusions: . lactated ringers      Active Problems:   Severe uncontrolled hypertension   Left pilon fracture   Nonunion of fracture     Edwin Rodriguez  [facility] Pager [phone]. If 8PM-8AM, please contact night-coverage at [email or web address], password [facility] 01/19/2013, 7:42 AM  LOS: 2 days

## 2013-01-19 NOTE — Progress Notes (Signed)
I saw and examined the patient with Mr. Paul, communicating the findings and plan noted above.  Anvika Gashi, MD [facility] [phone] (p)  

## 2013-01-19 NOTE — Anesthesia Procedure Notes (Signed)
Procedure Name: Intubation Date/Time: 01/19/2013 12:36 PM Performed by: Armandina Gemma Pre-anesthesia Checklist: Patient identified, Emergency Drugs available, Suction available, Patient being monitored and Timeout performed Patient Re-evaluated:Patient Re-evaluated prior to inductionOxygen Delivery Method: Circle system utilized Preoxygenation: Pre-oxygenation with 100% oxygen Intubation Type: IV induction and Cricoid Pressure applied Ventilation: Mask ventilation without difficulty Laryngoscope Size: Miller and 2 Grade View: Grade I Tube type: Oral Tube size: 7.5 mm Number of attempts: 1 Airway Equipment and Method: Stylet Placement Confirmation: ETT inserted through vocal cords under direct vision,  positive ETCO2 and breath sounds checked- equal and bilateral (bilat BS [place], atraumatic teeth as preop) Dental Injury: Teeth and Oropharynx as per pre-operative assessment

## 2013-01-19 NOTE — Progress Notes (Signed)
I agree with the findings above.  Budd Palmer, MD 01/19/2013 11:38 AM

## 2013-01-20 LAB — BASIC METABOLIC PANEL
BUN: 12 mg/dL (ref 6–23)
Chloride: 102 mEq/L (ref 96–112)
Creatinine, Ser: 0.88 mg/dL (ref 0.50–1.35)
GFR calc Af Amer: >90 mL/min (ref >90)
GFR calc non Af Amer: >90 mL/min (ref >90)
Potassium: 4.3 mEq/L (ref 3.5–5.1)

## 2013-01-20 LAB — CBC
HCT: 33.8 % — ABNORMAL LOW (ref 39.0–52.0)
Hemoglobin: 11.3 g/dL — ABNORMAL LOW (ref 13.0–17.0)
RBC: 3.92 MIL/uL — ABNORMAL LOW (ref 4.22–5.81)
WBC: 6 10*3/uL (ref 4.0–10.5)

## 2013-01-20 MED ORDER — OXYCODONE-ACETAMINOPHEN 5-325 MG PO TABS
1.0000 | ORAL_TABLET | Freq: Four times a day (QID) | ORAL | Status: DC | PRN
  Administered 2013-01-20 – 2013-01-22 (×7): 2 via ORAL
  Filled 2013-01-20 (×7): qty 2

## 2013-01-20 MED ORDER — ACETAMINOPHEN 325 MG PO TABS: 325.0000 mg | ORAL_TABLET | Freq: Four times a day (QID) | ORAL | Status: DC | PRN

## 2013-01-20 MED ORDER — OXYCODONE HCL 5 MG PO TABS
5.0000 mg | ORAL_TABLET | ORAL | Status: DC | PRN
  Administered 2013-01-20 (×3): 15 mg via ORAL
  Administered 2013-01-21: 5 mg via ORAL
  Administered 2013-01-21 – 2013-01-22 (×5): 15 mg via ORAL
  Administered 2013-01-22 (×4): 10 mg via ORAL
  Administered 2013-01-23: 15 mg via ORAL
  Administered 2013-01-23: 10 mg via ORAL
  Filled 2013-01-20: qty 3
  Filled 2013-01-20: qty 2
  Filled 2013-01-20 (×2): qty 3
  Filled 2013-01-20 (×2): qty 2
  Filled 2013-01-20 (×2): qty 3
  Filled 2013-01-20: qty 2
  Filled 2013-01-20 (×3): qty 3
  Filled 2013-01-20: qty 2
  Filled 2013-01-20: qty 3

## 2013-01-20 NOTE — Plan of Care (Signed)
Problem: Phase III Progression Outcomes Goal: Discharge plan remains appropriate-arrangements made May need HH OT for ADL trg and ADL mobility safety trg after acute d/c home

## 2013-01-20 NOTE — Progress Notes (Signed)
Orthopedic Tech Progress Note Patient Details:  Edwin Rodriguez 1971/04/07 [id number]  Ortho Devices Type of Ortho Device: CAM walker Ortho Device/Splint Interventions: Application   [name] 01/20/2013, 4:47 PM

## 2013-01-20 NOTE — Progress Notes (Signed)
[facility]uma [facility] (OTS)  Subjective: 1 Day Post-Op Procedure(s) (LRB): REMOVAL EXTERNAL FIXATION LEG (Left) OPEN REDUCTION INTERNAL FIXATION (ORIF) PILON  (Left)  Doing well Pain controlled with IV pain medication Blood pressure is much improved Tolerating diet No bowel movement but passing gas  Objective: Current Vitals Blood pressure 131/74, pulse 74, temperature 98.9 F (37.2 C), temperature source Oral, resp. rate 18, height 5' 11.5" (1.816 m), weight 113.399 kg (250 lb), SpO2 100.00%. Vital signs in last 24 hours: Temp:  [97.2 F (36.2 C)-98.9 F (37.2 C)] 98.9 F (37.2 C) (02/28 0515) Pulse Rate:  [51-118] 74 (02/28 0802) Resp:  [10-28] 18 (02/28 1128) BP: (122-194)/(74-127) 131/74 mmHg (02/28 1029) SpO2:  [94 %-100 %] 100 % (02/28 1128)  Intake/Output from previous day: 02/27 0701 - 02/28 0700 In: 4570 [P.O.:960; I.V.:3610] Out: 4225 [Urine:4100; Blood:125]  LABS  Recent Labs  01/20/13 0530  HGB 11.3*    Recent Labs  01/20/13 0530  WBC 6.0  RBC 3.92*  HCT 33.8*  PLT 154    Recent Labs  01/20/13 0530  NA 136  K 4.3  CL 102  CO2 27  BUN 12  CREATININE 0.88  GLUCOSE 109*  CALCIUM 8.6   No results found for this basename: LABPT, INR,  in the last 72 hours    Physical Exam  Gen: Awake and alert, no acute distress appears well Lungs: Clear Cardiac: Regular Abd: Soft with positive bowel sounds, nontender Ext:  Left lower extremity   Splint fitting well   No pain with passive stretch   Distal motor and sensory functions are intact   Extremity is warm   Swelling stable    Imaging Dg Tibia/fibula Left  01/19/2013  *RADIOLOGY REPORT*  Clinical Data: Left-sided fracture.  DG C-ARM 61-120 MIN,LEFT TIBIA AND FIBULA - 2 VIEW  Comparison:  10/11/2012.  Findings: 4 intraoperative images.  These demonstrate placement of a plate and screw fixation device about the lateral aspect of the distal tibia. Comminuted distal tibial fracture.   Residual medial displacement of the distal fracture fragments. Fixation of the distal anterior tibia, only imaged on the distal lateral view.  IMPRESSION: Internal fixation of markedly comminuted distal tibial fracture, with improved alignment.   Original Report Authenticated By: Jeronimo Greaves, M.D.    Dg Ankle Left Port  01/19/2013  *RADIOLOGY REPORT*  Clinical Data: Internal fixation of left tibial fracture.  PORTABLE LEFT ANKLE - 2 VIEW  Comparison: 10/11/2012  Findings: An overlying cast obscures fine detail. Internal plate and screw fixation is identified traversing a comminuted distal tibial fracture.  1 cm medial and 6 mm posterior displacement noted.  Near anatomic alignment is identified. A comminuted distal fibular fracture is again noted.  IMPRESSION: Internal plate and screw fixation traversing a comminuted distal tibial fracture as described.   Original Report Authenticated By: Harmon Pier, M.D.    Dg C-arm [id number] Min  01/19/2013  *RADIOLOGY REPORT*  Clinical Data: Left-sided fracture.  DG C-ARM 61-120 MIN,LEFT TIBIA AND FIBULA - 2 VIEW  Comparison:  10/11/2012.  Findings: 4 intraoperative images.  These demonstrate placement of a plate and screw fixation device about the lateral aspect of the distal tibia. Comminuted distal tibial fracture.  Residual medial displacement of the distal fracture fragments. Fixation of the distal anterior tibia, only imaged on the distal lateral view.  IMPRESSION: Internal fixation of markedly comminuted distal tibial fracture, with improved alignment.   Original Report Authenticated By: Jeronimo Greaves, M.D.     Assessment/Plan: 1  Day Post-Op Procedure(s) (LRB): REMOVAL EXTERNAL FIXATION LEG (Left) OPEN REDUCTION INTERNAL FIXATION (ORIF) PILON  (Left)  42 year old male with left pilon nonunion  1. Nonunion left pilon  Nonweightbearing  Up ad lib.   PT/OT  Ice and elevate  Toe and knee motion as tolerated  2. HTN urgency/Uncontrolled HTN              Appreciate medicine assistance             Continue per IM  Much improved  Added norvasc to regimen              Pt needs to establish care with PCP, will get SW involved pt lives in [place]  3. Pain             Continue with current regimen  D/c pca tonight  4. DVT/PE prophylaxis             Mobilize             Does not need to wear foot pumps  asa 5. FEN             Heart healthy             6. misc             Urine tox screen positive for marijuana, no cocaine             TSH 0.766 uIU/mL 7. Dispo  Plan for d/c home monday   Mearl Latin, PA-C [facility] [phone] (P) 01/20/2013, 1:23 PM

## 2013-01-20 NOTE — Progress Notes (Signed)
[facility] PROGRESS NOTE  Edwin Rodriguez ZOX:[id number] DOB: [birth year] DOA: 01/17/2013 PCP: No primary provider on file.  Assessment/Plan: 1. HTN - severe uncontrolled - suspect due to pain on top of severe HTN . Patient was placed  continue home meds. Added intravenous analgesics on 01/19/13. Also we did increase the dose of acei and added norvasc 10 mg QHS on 01/18/13    Code Status: full  Family Communication: patient  Disposition Plan: per primary team    HPI/Subjective: In pain post op although able to manage it well with PCA pump    Objective: Filed Vitals:   01/20/13 0515 01/20/13 0802 01/20/13 1029 01/20/13 1128  BP: 122/87 123/74 131/74   Pulse: 97 74    Temp: 98.9 F (37.2 C)     TempSrc: Oral     Resp: 16   18  Height:      Weight:      SpO2: 100%   100%   Patient Vitals for the past 24 hrs:  BP Temp Temp src Pulse Resp SpO2  01/20/13 1128 - - - - 18 100 %  01/20/13 1029 131/74 mmHg - - - - -  01/20/13 0802 123/74 mmHg - - 74 - -  01/20/13 0515 122/87 mmHg 98.9 F (37.2 C) Oral 97 16 100 %  01/20/13 0400 - - - - 19 100 %  01/20/13 0141 151/96 mmHg 98.7 F (37.1 C) Oral 97 16 100 %  01/20/13 0005 - - - - 28 100 %  01/19/13 2112 149/75 mmHg 98.9 F (37.2 C) Oral 96 16 100 %  01/19/13 2000 - - - - 13 100 %  01/19/13 1837 - - - - 19 98 %  01/19/13 1832 194/105 mmHg 98.5 F (36.9 C) - 86 20 96 %  01/19/13 1809 - 97.2 F (36.2 C) - - - -  01/19/13 1806 161/97 mmHg - - 78 14 94 %  01/19/13 1805 - - - 77 18 96 %  01/19/13 1804 - - - 69 18 95 %  01/19/13 1803 - - - 73 18 95 %  01/19/13 1802 - - - 70 20 94 %  01/19/13 1801 - - - 68 19 95 %  01/19/13 1800 - - - 81 22 97 %  01/19/13 1759 - - - 66 22 96 %  01/19/13 1758 - - - 79 13 98 %  01/19/13 1757 - - - 84 18 97 %  01/19/13 1756 - - - 79 21 97 %  01/19/13 1755 - - - 76 18 98 %  01/19/13 1754 - - - 69 20 97 %  01/19/13 1753 - - - 66 13 98 %  01/19/13 1752 180/108 mmHg - - 70 13 97 %  01/19/13  1751 157/100 mmHg - - 71 19 98 %  01/19/13 1750 180/108 mmHg - - 62 18 96 %  01/19/13 1749 - - - 63 21 97 %  01/19/13 1748 - - - 68 22 96 %  01/19/13 1747 180/108 mmHg - - 82 18 99 %  01/19/13 1746 - - - 66 13 99 %  01/19/13 1745 180/108 mmHg - - 64 15 99 %  01/19/13 1744 - - - 59 10 100 %  01/19/13 1743 - - - 118 12 98 %  01/19/13 1742 - - - 67 16 96 %  01/19/13 1741 - - - 57 12 97 %  01/19/13 1740 190/120 mmHg - - 61 13 98 %  01/19/13 1739 - - -  64 14 98 %  01/19/13 1738 - - - 51 17 99 %  01/19/13 1737 - - - 56 14 99 %  01/19/13 1736 190/120 mmHg - - 64 16 96 %  01/19/13 1735 - - - 54 17 100 %  01/19/13 1734 - - - 58 14 97 %  01/19/13 1733 - - - 104 15 100 %  01/19/13 1732 - - - 67 17 97 %  01/19/13 1731 - - - 64 19 99 %  01/19/13 1730 185/127 mmHg - - 69 19 97 %  01/19/13 1729 - - - 64 15 97 %  01/19/13 1728 - - - 65 18 97 %  01/19/13 1727 - - - 62 12 100 %  01/19/13 1726 - - - 70 15 99 %  01/19/13 1725 - - - 62 18 99 %  01/19/13 1724 - - - 67 19 97 %  01/19/13 1723 - - - 55 17 98 %  01/19/13 1722 193/111 mmHg - - 67 14 97 %  01/19/13 1721 - - - 60 13 98 %  01/19/13 1720 182/123 mmHg - - 63 13 99 %  01/19/13 1719 - - - 65 15 99 %  01/19/13 1718 - - - 63 18 100 %  01/19/13 1717 173/116 mmHg - - 54 16 99 %  01/19/13 1716 - - - 57 16 99 %  01/19/13 1715 - - - 55 13 98 %  01/19/13 1714 - - - 63 19 98 %  01/19/13 1713 - - - 62 13 98 %  01/19/13 1712 - - - 54 17 100 %  01/19/13 1711 - - - 56 14 96 %  01/19/13 1710 - - - 54 16 100 %  01/19/13 1709 - - - 59 15 100 %  01/19/13 1708 - - - 61 13 98 %  01/19/13 1707 - - - 59 11 100 %  01/19/13 1700 151/102 mmHg 97.8 F (36.6 C) - 57 20 100 %     Intake/Output Summary (Last 24 hours) at 01/20/13 1357 Last data filed at 01/20/13 0900  Gross per 24 hour  Intake   4050 ml  Output   3500 ml  Net    550 ml   Filed Weights   01/16/13 1633  Weight: 113.399 kg (250 lb)    Exam:   General:  axox3  Cardiovascular:  rrr  Respiratory: ctab   Abdomen: soft, nt   Data Reviewed: Basic Metabolic Panel:  Recent Labs Lab 01/17/13 1050 01/20/13 0530  NA 142 136  K 4.4 4.3  CL 106 102  CO2 30 27  GLUCOSE 90 109*  BUN 11 12  CREATININE 0.93 0.88  CALCIUM 9.9 8.6   Liver Function Tests:  Recent Labs Lab 01/17/13 1050  AST 16  ALT 20  ALKPHOS 72  BILITOT 0.3  PROT 7.7  ALBUMIN 3.7   No results found for this basename: LIPASE, AMYLASE,  in the last 168 hours No results found for this basename: AMMONIA,  in the last 168 hours CBC:  Recent Labs Lab 01/17/13 1050 01/20/13 0530  WBC 3.0* 6.0  NEUTROABS 1.4*  --   HGB 14.0 11.3*  HCT 39.8 33.8*  MCV 84.9 86.2  PLT 198 154   Cardiac Enzymes: No results found for this basename: CKTOTAL, CKMB, CKMBINDEX, TROPONINI,  in the last 168 hours BNP (last 3 results) No results found for this basename: PROBNP,  in the last 8760 hours CBG: No results  found for this basename: GLUCAP,  in the last 168 hours  Recent Results (from the past 240 hour(s))  SURGICAL PCR SCREEN     Status: None   Collection Time    01/17/13 10:43 AM      Result Value Range Status   MRSA, PCR NEGATIVE  NEGATIVE Final   Staphylococcus aureus NEGATIVE  NEGATIVE Final   Comment:            The Xpert SA Assay (FDA     approved for NASAL specimens     in patients over 83 years of age),     is one component of     a comprehensive surveillance     program.  Test performance has     been validated by [employer] for patients greater     than or equal to 62 year old.     It is not intended     to diagnose infection nor to     guide or monitor treatment.  GRAM STAIN     Status: None   Collection Time    01/19/13  2:54 PM      Result Value Range Status   Specimen Description TISSUE TIBIA LEFT   Final   Special Requests LEFT TIBIA NON UNION SITE  PT ON ANCEF   Final   Gram Stain     Final   Value: WBC PRESENT, PREDOMINANTLY MONONUCLEAR     NO ORGANISMS SEEN      Gram Stain Report Called to,Read Back By and Verified With: RN K. MCLELLAN 01/19/13 1607 Sallyanne Kuster M.   Report Status 01/19/2013 FINAL   Final  ANAEROBIC CULTURE     Status: None   Collection Time    01/19/13  2:54 PM      Result Value Range Status   Specimen Description TISSUE TIBIA LEFT   Final   Special Requests LEFT TIBIA NON UNION SITE  PT ON ANCEF   Final   Gram Stain     Final   Value: WBC PRESENT, PREDOMINANTLY MONONUCLEAR     NO ORGANISMS SEEN     Gram Stain Report Called to,Read Back By and Verified With: Gram Stain Report Called to,Read Back By and Verified With: K MCLELLAN(RN) @16 :07 ON 01/19/13 BY Sallyanne Kuster M. Performed at [facility]   Culture     Final   Value: NO ANAEROBES ISOLATED; CULTURE IN PROGRESS FOR 5 DAYS   Report Status PENDING   Incomplete  TISSUE CULTURE     Status: None   Collection Time    01/19/13  2:54 PM      Result Value Range Status   Specimen Description TISSUE TIBIA LEFT   Final   Special Requests LEFT TIBIA NON UNION SITE  PT ON ANCEF   Final   Gram Stain     Final   Value: WBC PRESENT, PREDOMINANTLY MONONUCLEAR     NO ORGANISMS SEEN     Gram Stain Report Called to,Read Back By and Verified With: Gram Stain Report Called to,Read Back By and Verified With: K.MCLELLAN(RN) @16 :07 ON 01/19/13 BY Sallyanne Kuster M. Performed at [facility]   Culture NO GROWTH   Final   Report Status PENDING   Incomplete     Studies: Dg Tibia/fibula Left  01/19/2013  *RADIOLOGY REPORT*  Clinical Data: Left-sided fracture.  DG C-ARM 61-120 MIN,LEFT TIBIA AND FIBULA - 2 VIEW  Comparison:  10/11/2012.  Findings: 4 intraoperative images.  These  demonstrate placement of a plate and screw fixation device about the lateral aspect of the distal tibia. Comminuted distal tibial fracture.  Residual medial displacement of the distal fracture fragments. Fixation of the distal anterior tibia, only imaged on the distal lateral view.  IMPRESSION: Internal fixation of markedly comminuted  distal tibial fracture, with improved alignment.   Original Report Authenticated By: Jeronimo Greaves, M.D.    Dg Ankle Left Port  01/19/2013  *RADIOLOGY REPORT*  Clinical Data: Internal fixation of left tibial fracture.  PORTABLE LEFT ANKLE - 2 VIEW  Comparison: 10/11/2012  Findings: An overlying cast obscures fine detail. Internal plate and screw fixation is identified traversing a comminuted distal tibial fracture.  1 cm medial and 6 mm posterior displacement noted.  Near anatomic alignment is identified. A comminuted distal fibular fracture is again noted.  IMPRESSION: Internal plate and screw fixation traversing a comminuted distal tibial fracture as described.   Original Report Authenticated By: Harmon Pier, M.D.    Dg C-arm [phone] Min  01/19/2013  *RADIOLOGY REPORT*  Clinical Data: Left-sided fracture.  DG C-ARM 61-120 MIN,LEFT TIBIA AND FIBULA - 2 VIEW  Comparison:  10/11/2012.  Findings: 4 intraoperative images.  These demonstrate placement of a plate and screw fixation device about the lateral aspect of the distal tibia. Comminuted distal tibial fracture.  Residual medial displacement of the distal fracture fragments. Fixation of the distal anterior tibia, only imaged on the distal lateral view.  IMPRESSION: Internal fixation of markedly comminuted distal tibial fracture, with improved alignment.   Original Report Authenticated By: Jeronimo Greaves, M.D.     Scheduled Meds: . amLODipine  10 mg Oral QHS  . carvedilol  6.25 mg Oral BID WC  .  ceFAZolin (ANCEF) IV  1 g Intravenous Q8H  . cloNIDine  0.3 mg Oral BID  . docusate sodium  100 mg Oral BID  . docusate sodium  100 mg Oral BID  . enoxaparin (LOVENOX) injection  40 mg Subcutaneous Q24H  . lisinopril  10 mg Oral Daily  . multivitamin with minerals  1 tablet Oral Daily  . mupirocin ointment   Nasal Once  . polyethylene glycol  17 g Oral Daily   Continuous Infusions: . 0.9 % NaCl with KCl 20 mEq / L    . lactated ringers      Active  Problems:   Severe uncontrolled hypertension   Left pilon fracture   Nonunion of fracture     Edwin Rodriguez  [facility] Pager [phone]. If 8PM-8AM, please contact night-coverage at [email or web address], password [facility] 01/20/2013, 1:57 PM  LOS: 3 days

## 2013-01-20 NOTE — Evaluation (Signed)
Physical Therapy Evaluation Patient Details Name: Edwin Rodriguez MRN: [id number] DOB: Sep 04, 1971 Today's Date: 01/20/2013 Time: 4098-1191 PT Time Calculation (min): 29 min  PT Assessment / Plan / Recommendation Clinical Impression  pt presents with L Tib fx s/p ORIF and External Fixators removed.  pt Has been ModI at home prior to returning to hospital for ORIF.  pt notes RW has been getting in the way when trying to fit throught doors at home and wants to try crutches.  Feel pt would do well with crutches.  Will need order for Ortho Tech to provide crutches.      PT Assessment  Patient needs continued PT services    Follow Up Recommendations  Outpatient PT (In future once cleared by MD.  )    Does the patient have the potential to tolerate intense rehabilitation      Barriers to Discharge None      Equipment Recommendations   (Crutches)    Recommendations for Other Services     Frequency Min 5X/week    Precautions / Restrictions Precautions Precautions: Fall Restrictions Weight Bearing Restrictions: Yes LLE Weight Bearing: Non weight bearing   Pertinent Vitals/Pain Indicates pain 7-8/10.  Premedicated.        Mobility  Bed Mobility Bed Mobility: Supine to Sit;Sitting - Scoot to Edge of Bed Supine to Sit: 6: Modified independent (Device/Increase time);With rails Sitting - Scoot to Edge of Bed: 6: Modified independent (Device/Increase time) Details for Bed Mobility Assistance: No A needed.   Transfers Transfers: Sit to Stand;Stand to Sit Sit to Stand: 4: Min guard;With upper extremity assist;From bed Stand to Sit: 4: Min guard;With upper extremity assist;To chair/3-in-1;With armrests Details for Transfer Assistance: Only cues for placement of crutches with transfers.   Ambulation/Gait Ambulation/Gait Assistance: 4: Min guard Ambulation Distance (Feet): 30 Feet Assistive device: Crutches Ambulation/Gait Assistance Details: cues for safe use of crutches Gait  Pattern: Step-to pattern Stairs: No Wheelchair Mobility Wheelchair Mobility: No    Exercises     PT Diagnosis: Difficulty walking;Acute pain  PT Problem List: Decreased activity tolerance;Decreased balance;Decreased mobility;Decreased knowledge of use of DME;Pain PT Treatment Interventions: DME instruction;Gait training;Stair training;Functional mobility training;Therapeutic activities;Therapeutic exercise;Balance training;Patient/family education   PT Goals Acute Rehab PT Goals PT Goal Formulation: With patient Time For Goal Achievement: 01/27/13 Potential to Achieve Goals: Good Pt will go Sit to Stand: with modified independence PT Goal: Sit to Stand - Progress: Goal set today Pt will go Stand to Sit: with modified independence PT Goal: Stand to Sit - Progress: Goal set today Pt will Ambulate: >150 feet;with modified independence;with crutches PT Goal: Ambulate - Progress: Goal set today Pt will Go Up / Down Stairs: 3-5 stairs;with supervision;with crutches PT Goal: Up/Down Stairs - Progress: Goal set today  Visit Information  Last PT Received On: 01/20/13 Assistance Needed: +1    Subjective Data  Subjective: I want to try crutches.   Patient Stated Goal: Home   Prior Functioning  Home Living Lives With: Significant other;Son;Daughter Available Help at Discharge: Family Type of Home: House Home Layout: One level Bathroom Shower/Tub: Engineer, manufacturing systems: Standard Home Adaptive Equipment: Shower chair without back;Shower chair with back Prior Function Level of Independence: Independent Able to Take Stairs?: Yes Driving: Yes Vocation: Unemployed Communication Communication: No difficulties Dominant Hand: Left    Cognition  Cognition Overall Cognitive Status: Appears within functional limits for tasks assessed/performed Arousal/Alertness: Awake/alert Orientation Level: Oriented X4 / Intact Behavior During Session: [facility] for tasks performed  Extremity/Trunk Assessment Right Upper Extremity Assessment RUE ROM/Strength/Tone: WFL for tasks assessed Left Upper Extremity Assessment LUE ROM/Strength/Tone: WFL for tasks assessed Right Lower Extremity Assessment RLE ROM/Strength/Tone: WFL for tasks assessed RLE Sensation: WFL - Light Touch Left Lower Extremity Assessment LLE ROM/Strength/Tone: Deficits LLE ROM/Strength/Tone Deficits: Hip/knee WFL, ankle/foot casted.   LLE Sensation: WFL - Light Touch Trunk Assessment Trunk Assessment: Normal   Balance Balance Balance Assessed: No  End of Session PT - End of Session Equipment Utilized During Treatment: Gait belt Activity Tolerance: Patient tolerated treatment well Patient left: in chair;with call bell/phone within reach Nurse Communication: Mobility status  GP     Sunny Schlein, [place] [phone] 01/20/2013, 12:50 PM
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
## 2013-01-20 NOTE — Progress Notes (Signed)
Orthopedic Tech Progress Note Patient Details:  Edwin Rodriguez [birth year] [id number]  Ortho Devices Type of Ortho Device: CAM walker Ortho Device/Splint Interventions: Ordered   Shawnie Pons 01/20/2013, 3:21 PM

## 2013-01-20 NOTE — Evaluation (Signed)
Occupational Therapy Evaluation Patient Details Name: Edwin Rodriguez MRN: [id number] DOB: 03-Dec-1970 Today's Date: 01/20/2013 Time: 7829-5621 OT Time Calculation (min): 19 min  OT Assessment / Plan / Recommendation Clinical Impression  Pt demos decline in function with LB ADLs, balance, safety and activity tolerance following L LE surgery for tibial fx. Pt would benefit from OT services to address these impairments to help restore PLOF to return home safely    OT Assessment  Patient needs continued OT Services    Follow Up Recommendations  Home health OT    Barriers to Discharge None    Equipment Recommendations  3 in 1 bedside comode;Tub/shower seat;Other (comment) Lexicographer)    Recommendations for Other Services    Frequency  Min 2X/week    Precautions / Restrictions Precautions Precautions: Fall Restrictions Weight Bearing Restrictions: Yes LLE Weight Bearing: Non weight bearing   Pertinent Vitals/Pain     ADL  Grooming: Performed;Min guard;Minimal assistance Where Assessed - Grooming: Supported standing Upper Body Bathing: Simulated;Supervision/safety;Set up Lower Body Bathing: Simulated;Minimal assistance Upper Body Dressing: Performed;Supervision/safety;Set up Lower Body Dressing: Performed;Minimal assistance Toilet Transfer: Performed;Min guard;Minimal assistance Toilet Transfer Method: Sit to stand;Other (comment) (ambulating with crutches) Toilet Transfer Equipment: Raised toilet seat with arms (or 3-in-1 over toilet) Toileting - Clothing Manipulation and Hygiene: Minimal assistance Where Assessed - Toileting Clothing Manipulation and Hygiene: Standing ADL Comments: pt provided with education and demo of ADL A/E for home use    OT Diagnosis: Generalized weakness;Acute pain  OT Problem List: Decreased knowledge of use of DME or AE;Pain;Impaired balance (sitting and/or standing);Decreased activity tolerance OT Treatment Interventions: Self-care/ADL  training;Balance training;Therapeutic exercise;Neuromuscular education;Therapeutic activities;DME and/or AE instruction;Patient/family education   OT Goals Acute Rehab OT Goals OT Goal Formulation: With patient Time For Goal Achievement: 01/27/13 Potential to Achieve Goals: Good ADL Goals Pt Will Perform Grooming: with set-up;with supervision;Standing at sink;Supported;Unsupported ADL Goal: Grooming - Progress: Goal set today Pt Will Perform Lower Body Bathing: with set-up;with supervision ADL Goal: Lower Body Bathing - Progress: Goal set today Pt Will Perform Lower Body Dressing: with set-up;with supervision ADL Goal: Lower Body Dressing - Progress: Goal set today Pt Will Transfer to Toilet: with supervision;with DME;Grab bars ADL Goal: Toilet Transfer - Progress: Goal set today Pt Will Perform Toileting - Clothing Manipulation: with supervision;Standing;Sitting on 3-in-1 or toilet ADL Goal: Toileting - Clothing Manipulation - Progress: Goal set today Pt Will Perform Tub/Shower Transfer: Tub transfer;Transfer tub bench;with DME ADL Goal: Tub/Shower Transfer - Progress: Goal set today  Visit Information  Last OT Received On: 01/20/13 Assistance Needed: +1    Subjective Data  Subjective: " I have been doing this stuff already " Patient Stated Goal: To return home   Prior Functioning     Home Living Lives With: Significant other;Son;Daughter Available Help at Discharge: Family Type of Home: House Home Layout: One level Bathroom Shower/Tub: Engineer, manufacturing systems: Standard Home Adaptive Equipment: Shower chair without back;Shower chair with back Prior Function Level of Independence: Independent Able to Take Stairs?: Yes Driving: Yes Vocation: Unemployed Communication Communication: No difficulties Dominant Hand: Left         Vision/Perception Vision - History Baseline Vision: No visual deficits Patient Visual Report: No change from  baseline Perception Perception: Within Functional Limits   Cognition  Cognition Overall Cognitive Status: Appears within functional limits for tasks assessed/performed Arousal/Alertness: Awake/alert Orientation Level: Oriented X4 / Intact Behavior During Session: [facility] for tasks performed    Extremity/Trunk Assessment Right Upper Extremity Assessment RUE ROM/Strength/Tone: [facility] for tasks  assessed Left Upper Extremity Assessment LUE ROM/Strength/Tone: WFL for tasks assessed Right Lower Extremity Assessment RLE ROM/Strength/Tone: WFL for tasks assessed RLE Sensation: WFL - Light Touch Left Lower Extremity Assessment LLE ROM/Strength/Tone: Deficits LLE ROM/Strength/Tone Deficits: Hip/knee WFL, ankle/foot casted.   LLE Sensation: WFL - Light Touch Trunk Assessment Trunk Assessment: Normal     Mobility Bed Mobility Bed Mobility: Supine to Sit;Sitting - Scoot to Edge of Bed Supine to Sit: 6: Modified independent (Device/Increase time);With rails Sitting - Scoot to Edge of Bed: 6: Modified independent (Device/Increase time) Details for Bed Mobility Assistance: No A needed.   Transfers Sit to Stand: 4: Min guard;With upper extremity assist;From bed Stand to Sit: 4: Min guard;With upper extremity assist;To chair/3-in-1;With armrests Details for Transfer Assistance: Only cues for placement of crutches with transfers.       Exercise     Balance Balance Balance Assessed: No   End of Session OT - End of Session Equipment Utilized During Treatment: Gait belt (crutches, reacher, 3 in 1) Activity Tolerance: Patient tolerated treatment well;Other (comment) (reports dizziness when OOB) Patient left: in chair;with call bell/phone within reach  GO     [name] 01/20/2013, 1:12 PM

## 2013-01-21 MED ORDER — DIPHENHYDRAMINE HCL 25 MG PO CAPS
25.0000 mg | ORAL_CAPSULE | Freq: Four times a day (QID) | ORAL | Status: DC | PRN
  Administered 2013-01-21 – 2013-01-22 (×2): 25 mg via ORAL
  Filled 2013-01-21 (×2): qty 1

## 2013-01-21 NOTE — Progress Notes (Signed)
NCM waiting PT/OT recommendations. Isidoro Donning RN CCM Case Mgmt phone [phone]

## 2013-01-21 NOTE — Progress Notes (Signed)
[facility] PROGRESS NOTE  Rami Budhu WUJ:[id number] DOB: 09/25/1971 DOA: 01/17/2013 PCP: No primary provider on file.  Assessment/Plan: 1. HTN - severe uncontrolled - suspect due to pain on top of severe HTN . Patient was placed  continue home meds. Added intravenous analgesics on 01/19/13. Also we did increase the dose of acei and added norvasc 10 mg QHS on 01/18/13   We will recheck renal function 3/2 now that we achieved acceptable BP control.   Code Status: full  Family Communication: patient  Disposition Plan: per primary team    HPI/Subjective: Less pain, no dyspnea no cp  Objective: Filed Vitals:   01/20/13 1958 01/20/13 2311 01/21/13 0604 01/21/13 0811  BP: 146/82  157/93 135/77  Pulse: 96  100 84  Temp: 99.4 F (37.4 C) 99.1 F (37.3 C) 98.9 F (37.2 C)   TempSrc: Oral Oral Oral   Resp: 18  18   Height:      Weight:      SpO2: 99%  94%    Patient Vitals for the past 24 hrs:  BP Temp Temp src Pulse Resp SpO2  01/21/13 0811 135/77 mmHg - - 84 - -  01/21/13 0604 157/93 mmHg 98.9 F (37.2 C) Oral 100 18 94 %  01/20/13 2311 - 99.1 F (37.3 C) Oral - - -  01/20/13 1958 146/82 mmHg 99.4 F (37.4 C) Oral 96 18 99 %  01/20/13 1402 133/71 mmHg 99.7 F (37.6 C) - 86 20 100 %  01/20/13 1128 - - - - 18 100 %  01/20/13 1029 131/74 mmHg - - - - -     Intake/Output Summary (Last 24 hours) at 01/21/13 0827 Last data filed at 01/21/13 0810  Gross per 24 hour  Intake   1200 ml  Output   3750 ml  Net  -2550 ml   Filed Weights   01/16/13 1633  Weight: 113.399 kg (250 lb)    Exam:   General:  axox3  Cardiovascular: rrr  Respiratory: ctab   Abdomen: soft, nt   Data Reviewed: Basic Metabolic Panel:  Recent Labs Lab 01/17/13 1050 01/20/13 0530  NA 142 136  K 4.4 4.3  CL 106 102  CO2 30 27  GLUCOSE 90 109*  BUN 11 12  CREATININE 0.93 0.88  CALCIUM 9.9 8.6   Liver Function Tests:  Recent Labs Lab 01/17/13 1050  AST 16  ALT 20   ALKPHOS 72  BILITOT 0.3  PROT 7.7  ALBUMIN 3.7   No results found for this basename: LIPASE, AMYLASE,  in the last 168 hours No results found for this basename: AMMONIA,  in the last 168 hours CBC:  Recent Labs Lab 01/17/13 1050 01/20/13 0530  WBC 3.0* 6.0  NEUTROABS 1.4*  --   HGB 14.0 11.3*  HCT 39.8 33.8*  MCV 84.9 86.2  PLT 198 154   Cardiac Enzymes: No results found for this basename: CKTOTAL, CKMB, CKMBINDEX, TROPONINI,  in the last 168 hours BNP (last 3 results) No results found for this basename: PROBNP,  in the last 8760 hours CBG: No results found for this basename: GLUCAP,  in the last 168 hours  Recent Results (from the past 240 hour(s))  SURGICAL PCR SCREEN     Status: None   Collection Time    01/17/13 10:43 AM      Result Value Range Status   MRSA, PCR NEGATIVE  NEGATIVE Final   Staphylococcus aureus NEGATIVE  NEGATIVE Final   Comment:  The Xpert SA Assay (FDA     approved for NASAL specimens     in patients over 58 years of age),     is one component of     a comprehensive surveillance     program.  Test performance has     been validated by [employer] for patients greater     than or equal to 19 year old.     It is not intended     to diagnose infection nor to     guide or monitor treatment.  GRAM STAIN     Status: None   Collection Time    01/19/13  2:54 PM      Result Value Range Status   Specimen Description TISSUE TIBIA LEFT   Final   Special Requests LEFT TIBIA NON UNION SITE  PT ON ANCEF   Final   Gram Stain     Final   Value: WBC PRESENT, PREDOMINANTLY MONONUCLEAR     NO ORGANISMS SEEN     Gram Stain Report Called to,Read Back By and Verified With: RN K. MCLELLAN 01/19/13 1607 Sallyanne Kuster M.   Report Status 01/19/2013 FINAL   Final  ANAEROBIC CULTURE     Status: None   Collection Time    01/19/13  2:54 PM      Result Value Range Status   Specimen Description TISSUE TIBIA LEFT   Final   Special Requests LEFT TIBIA NON  UNION SITE  PT ON ANCEF   Final   Gram Stain     Final   Value: WBC PRESENT, PREDOMINANTLY MONONUCLEAR     NO ORGANISMS SEEN     Gram Stain Report Called to,Read Back By and Verified With: Gram Stain Report Called to,Read Back By and Verified With: K MCLELLAN(RN) @16 :07 ON 01/19/13 BY Sallyanne Kuster M. Performed at [facility]   Culture     Final   Value: NO ANAEROBES ISOLATED; CULTURE IN PROGRESS FOR 5 DAYS   Report Status PENDING   Incomplete  TISSUE CULTURE     Status: None   Collection Time    01/19/13  2:54 PM      Result Value Range Status   Specimen Description TISSUE TIBIA LEFT   Final   Special Requests LEFT TIBIA NON UNION SITE  PT ON ANCEF   Final   Gram Stain     Final   Value: WBC PRESENT, PREDOMINANTLY MONONUCLEAR     NO ORGANISMS SEEN     Gram Stain Report Called to,Read Back By and Verified With: Gram Stain Report Called to,Read Back By and Verified With: K.MCLELLAN(RN) @16 :07 ON 01/19/13 BY Sallyanne Kuster M. Performed at [facility]   Culture NO GROWTH 2 DAYS   Final   Report Status PENDING   Incomplete     Studies: Dg Tibia/fibula Left  01/19/2013  *RADIOLOGY REPORT*  Clinical Data: Left-sided fracture.  DG C-ARM 61-120 MIN,LEFT TIBIA AND FIBULA - 2 VIEW  Comparison:  10/11/2012.  Findings: 4 intraoperative images.  These demonstrate placement of a plate and screw fixation device about the lateral aspect of the distal tibia. Comminuted distal tibial fracture.  Residual medial displacement of the distal fracture fragments. Fixation of the distal anterior tibia, only imaged on the distal lateral view.  IMPRESSION: Internal fixation of markedly comminuted distal tibial fracture, with improved alignment.   Original Report Authenticated By: Jeronimo Greaves, M.D.    Dg Ankle Left Port  01/19/2013  *RADIOLOGY  REPORT*  Clinical Data: Internal fixation of left tibial fracture.  PORTABLE LEFT ANKLE - 2 VIEW  Comparison: 10/11/2012  Findings: An overlying cast obscures fine detail. Internal  plate and screw fixation is identified traversing a comminuted distal tibial fracture.  1 cm medial and 6 mm posterior displacement noted.  Near anatomic alignment is identified. A comminuted distal fibular fracture is again noted.  IMPRESSION: Internal plate and screw fixation traversing a comminuted distal tibial fracture as described.   Original Report Authenticated By: Harmon Pier, M.D.    Dg C-arm [phone] Min  01/19/2013  *RADIOLOGY REPORT*  Clinical Data: Left-sided fracture.  DG C-ARM 61-120 MIN,LEFT TIBIA AND FIBULA - 2 VIEW  Comparison:  10/11/2012.  Findings: 4 intraoperative images.  These demonstrate placement of a plate and screw fixation device about the lateral aspect of the distal tibia. Comminuted distal tibial fracture.  Residual medial displacement of the distal fracture fragments. Fixation of the distal anterior tibia, only imaged on the distal lateral view.  IMPRESSION: Internal fixation of markedly comminuted distal tibial fracture, with improved alignment.   Original Report Authenticated By: Jeronimo Greaves, M.D.     Scheduled Meds: . amLODipine  10 mg Oral QHS  . carvedilol  6.25 mg Oral BID WC  . cloNIDine  0.3 mg Oral BID  . docusate sodium  100 mg Oral BID  . docusate sodium  100 mg Oral BID  . enoxaparin (LOVENOX) injection  40 mg Subcutaneous Q24H  . lisinopril  10 mg Oral Daily  . multivitamin with minerals  1 tablet Oral Daily  . mupirocin ointment   Nasal Once  . polyethylene glycol  17 g Oral Daily   Continuous Infusions: . 0.9 % NaCl with KCl 20 mEq / L 20 mL/hr at 01/20/13 1621  . lactated ringers      Active Problems:   Severe uncontrolled hypertension   Left pilon fracture   Nonunion of fracture     Blakely Gluth  [facility] Pager [phone]. If 8PM-8AM, please contact night-coverage at [email or web address], password [facility] 01/21/2013, 8:27 AM  LOS: 4 days

## 2013-01-21 NOTE — Progress Notes (Signed)
Subjective: 2 Days Post-Op Procedure(s) (LRB): REMOVAL EXTERNAL FIXATION LEG (Left) OPEN REDUCTION INTERNAL FIXATION (ORIF) PILON  (Left) Patient reports pain as moderate.    Objective: Vital signs in last 24 hours: Temp:  [98.9 F (37.2 C)-99.7 F (37.6 C)] 98.9 F (37.2 C) (03/01 0604) Pulse Rate:  [84-100] 84 (03/01 0811) Resp:  [18-20] 18 (03/01 0604) BP: (131-157)/(71-93) 135/77 mmHg (03/01 0811) SpO2:  [94 %-100 %] 94 % (03/01 0604)  Intake/Output from previous day: 02/28 0701 - 03/01 0700 In: 1200 [P.O.:1200] Out: 3300 [Urine:3300] Intake/Output this shift: Total I/O In: -  Out: 450 [Urine:450]   Recent Labs  01/20/13 0530  HGB 11.3*    Recent Labs  01/20/13 0530  WBC 6.0  RBC 3.92*  HCT 33.8*  PLT 154    Recent Labs  01/20/13 0530  NA 136  K 4.3  CL 102  CO2 27  BUN 12  CREATININE 0.88  GLUCOSE 109*  CALCIUM 8.6   No results found for this basename: LABPT, INR,  in the last 72 hours  Neurologically intact  Assessment/Plan: 2 Days Post-Op Procedure(s) (LRB): REMOVAL EXTERNAL FIXATION LEG (Left) OPEN REDUCTION INTERNAL FIXATION (ORIF) PILON  (Left) Discharge home with home health  Mohogany Toppins JR,W D 01/21/2013, 9:39 AM

## 2013-01-21 NOTE — Progress Notes (Signed)
OT Cancellation Note  Patient Details Name: Edwin Rodriguez MRN: [id number] DOB: 05-Oct-1971   Cancelled Treatment:    Reason Eval/Treat Not Completed: Pain limiting ability to participate. C/o right shoulder pain. States he wants to rest today with ice and try again another day.  Robet Leu 01/21/2013, 12:13 PM

## 2013-01-21 NOTE — Progress Notes (Signed)
PT Cancellation Note  Patient Details Name: Edwin Rodriguez MRN: [id number] DOB: 04/05/1971   Cancelled Treatment:    Reason Eval/Treat Not Completed: Pain limiting ability to participate   Fredrich Birks 01/21/2013, 9:50 AM

## 2013-01-22 ENCOUNTER — Encounter (HOSPITAL_COMMUNITY): Payer: Self-pay | Admitting: Orthopedic Surgery

## 2013-01-22 LAB — BASIC METABOLIC PANEL
Calcium: 9.9 mg/dL (ref 8.4–10.5)
GFR calc Af Amer: >90 mL/min (ref >90)
GFR calc non Af Amer: >90 mL/min (ref >90)
Sodium: 136 mEq/L (ref 135–145)

## 2013-01-22 LAB — TISSUE CULTURE

## 2013-01-22 LAB — CBC
MCH: 29.2 pg (ref 26.0–34.0)
MCHC: 34 g/dL (ref 30.0–36.0)
Platelets: 187 10*3/uL (ref 150–400)

## 2013-01-22 NOTE — Progress Notes (Signed)
Physical Therapy Treatment Patient Details Name: Edwin Rodriguez MRN: [id number] DOB: [birth year] Today's Date: 01/22/2013 Time:  -     PT Assessment / Plan / Recommendation Comments on Treatment Session  Pt pleasant & willing to participate.  moves fairly well.  Pt requesting to cont use of RW at this date due to does not feel comfortable/steady with crutches.      Follow Up Recommendations  Outpatient PT (in future once cleared by MD)     Does the patient have the potential to tolerate intense rehabilitation     Barriers to Discharge        Equipment Recommendations  Other (comment) (crutches)    Recommendations for Other Services    Frequency Min 5X/week   Plan Discharge plan remains appropriate    Precautions / Restrictions Precautions Precautions: Fall Restrictions Weight Bearing Restrictions: Yes LLE Weight Bearing: Non weight bearing       Mobility  Bed Mobility Bed Mobility: Not assessed Transfers Transfers: Sit to Stand;Stand to Sit Sit to Stand: 4: Min guard;With upper extremity assist;With armrests;From chair/3-in-1 Stand to Sit: To chair/3-in-1;With armrests;With upper extremity assist;5: Supervision Details for Transfer Assistance: guarding for safety.  Cues to pivot his body around & square up with chair before sitting.   Ambulation/Gait Ambulation/Gait Assistance: 4: Min guard Ambulation Distance (Feet): 40 Feet Assistive device: Rolling walker Ambulation/Gait Assistance Details: Pt requesting to stay with RW at this time due to he feels more comfortable with RW.    Gait Pattern: Step-to pattern Stairs: Yes Stairs Assistance: 4: Min assist;4: Min guard Stairs Assistance Details (indicate cue type and reason): (A) for stability with use of crutches.  Pt then demonstrated technique for how he negotiates steps with use of RW.  Pt reports he does not feel comfortable with use of crutches at this time & prefers to cont up/down steps the way he has been doing.    Stair Management Technique: With crutches;Forwards;Step to pattern;With walker Number of Stairs: 2 (2x's) Wheelchair Mobility Wheelchair Mobility: No      PT Goals Acute Rehab PT Goals Time For Goal Achievement: 01/27/13 Potential to Achieve Goals: Good Pt will go Sit to Stand: with modified independence PT Goal: Sit to Stand - Progress: Progressing toward goal Pt will go Stand to Sit: with modified independence PT Goal: Stand to Sit - Progress: Progressing toward goal Pt will Ambulate: >150 feet;with modified independence;with crutches PT Goal: Ambulate - Progress: Progressing toward goal Pt will Go Up / Down Stairs: 3-5 stairs;with supervision;with crutches PT Goal: Up/Down Stairs - Progress: Progressing toward goal  Visit Information  Last PT Received On: 01/22/13 Assistance Needed: +1    Subjective Data  Subjective: Pt heading into bathroom to get bathed up but agreeable to participate in therapy before doing so.  Patient Stated Goal: Home   Cognition  Cognition Overall Cognitive Status: Appears within functional limits for tasks assessed/performed Arousal/Alertness: Awake/alert Orientation Level: Oriented X4 / Intact Behavior During Session: [facility] for tasks performed    Balance     End of Session PT - End of Session Equipment Utilized During Treatment: Gait belt Activity Tolerance: Patient tolerated treatment well Patient left: in chair;with call bell/phone within reach Nurse Communication: Mobility status     Verdell Face, [place] [phone] 01/22/2013

## 2013-01-22 NOTE — Progress Notes (Signed)
[facility] PROGRESS NOTE  Edwin Rodriguez WUJ:[id number] DOB: 13-Mar-1971 DOA: 01/17/2013 PCP: No primary [name] on file.  Assessment/Plan: 1. HTN - severe uncontrolled - suspect due to pain on top of severe HTN . Patient was continued on  home meds. Also we did increase the dose of acei and added norvasc 10 mg QHS on 01/18/13    recheck renal function 3/2  Looking great  We have achieved great BP control.  Would recommend to continue current regimen at DC  Sign off  Call with questions   Code Status: full  Family Communication: patient  Disposition Plan: per primary team    HPI/Subjective: Less pain, no dyspnea no cp  Objective: Filed Vitals:   01/21/13 1400 01/21/13 2108 01/21/13 2132 01/22/13 0538  BP: 138/78 140/90 141/82 134/85  Pulse: 85  72 69  Temp: 99 F (37.2 C)  98.7 F (37.1 C) 98.6 F (37 C)  TempSrc: Oral     Resp: 20  18 18   Height:      Weight:      SpO2: 100%  100% 99%   Patient Vitals for the past 24 hrs:  BP Temp Temp src Pulse Resp SpO2  01/22/13 0538 134/85 mmHg 98.6 F (37 C) - 69 18 99 %  01/21/13 2132 141/82 mmHg 98.7 F (37.1 C) - 72 18 100 %  01/21/13 2108 140/90 mmHg - - - - -  01/21/13 1400 138/78 mmHg 99 F (37.2 C) Oral 85 20 100 %  01/21/13 1100 152/94 mmHg - - 84 - -     Intake/Output Summary (Last 24 hours) at 01/22/13 0840 Last data filed at 01/22/13 0539  Gross per 24 hour  Intake   1200 ml  Output   1700 ml  Net   -500 ml   Filed Weights   01/16/13 1633  Weight: 113.399 kg (250 lb)    Exam:   General:  axox3  Cardiovascular: rrr  Respiratory: ctab   Abdomen: soft, nt   Data Reviewed: Basic Metabolic Panel:  Recent Labs Lab 01/17/13 1050 01/20/13 0530  NA 142 136  K 4.4 4.3  CL 106 102  CO2 30 27  GLUCOSE 90 109*  BUN 11 12  CREATININE 0.93 0.88  CALCIUM 9.9 8.6   Liver Function Tests:  Recent Labs Lab 01/17/13 1050  AST 16  ALT 20  ALKPHOS 72  BILITOT 0.3  PROT 7.7  ALBUMIN  3.7   No results found for this basename: LIPASE, AMYLASE,  in the last 168 hours No results found for this basename: AMMONIA,  in the last 168 hours CBC:  Recent Labs Lab 01/17/13 1050 01/20/13 0530 01/22/13 0757  WBC 3.0* 6.0 4.5  NEUTROABS 1.4*  --   --   HGB 14.0 11.3* 12.4*  HCT 39.8 33.8* 36.5*  MCV 84.9 86.2 86.1  PLT 198 154 187   Cardiac Enzymes: No results found for this basename: CKTOTAL, CKMB, CKMBINDEX, TROPONINI,  in the last 168 hours BNP (last 3 results) No results found for this basename: PROBNP,  in the last 8760 hours CBG: No results found for this basename: GLUCAP,  in the last 168 hours  Recent Results (from the past 240 hour(s))  SURGICAL PCR SCREEN     Status: None   Collection Time    01/17/13 10:43 AM      Result Value Range Status   MRSA, PCR NEGATIVE  NEGATIVE Final   Staphylococcus aureus NEGATIVE  NEGATIVE Final   Comment:  The Xpert SA Assay (FDA     approved for NASAL specimens     in patients over 75 years of age),     is one component of     a comprehensive surveillance     program.  Test performance has     been validated by [employer] for patients greater     than or equal to 43 year old.     It is not intended     to diagnose infection nor to     guide or monitor treatment.  GRAM STAIN     Status: None   Collection Time    01/19/13  2:54 PM      Result Value Range Status   Specimen Description TISSUE TIBIA LEFT   Final   Special Requests LEFT TIBIA NON UNION SITE  PT ON ANCEF   Final   Gram Stain     Final   Value: WBC PRESENT, PREDOMINANTLY MONONUCLEAR     NO ORGANISMS SEEN     Gram Stain Report Called to,Read Back By and Verified With: RN K. MCLELLAN 01/19/13 1607 Sallyanne Kuster M.   Report Status 01/19/2013 FINAL   Final  ANAEROBIC CULTURE     Status: None   Collection Time    01/19/13  2:54 PM      Result Value Range Status   Specimen Description TISSUE TIBIA LEFT   Final   Special Requests LEFT TIBIA NON UNION  SITE  PT ON ANCEF   Final   Gram Stain     Final   Value: WBC PRESENT, PREDOMINANTLY MONONUCLEAR     NO ORGANISMS SEEN     Gram Stain Report Called to,Read Back By and Verified With: Gram Stain Report Called to,Read Back By and Verified With: K MCLELLAN(RN) @16 :07 ON 01/19/13 BY Sallyanne Kuster M. Performed at [facility]   Culture     Final   Value: NO ANAEROBES ISOLATED; CULTURE IN PROGRESS FOR 5 DAYS   Report Status PENDING   Incomplete  TISSUE CULTURE     Status: None   Collection Time    01/19/13  2:54 PM      Result Value Range Status   Specimen Description TISSUE TIBIA LEFT   Final   Special Requests LEFT TIBIA NON UNION SITE  PT ON ANCEF   Final   Gram Stain     Final   Value: WBC PRESENT, PREDOMINANTLY MONONUCLEAR     NO ORGANISMS SEEN     Gram Stain Report Called to,Read Back By and Verified With: Gram Stain Report Called to,Read Back By and Verified With: K.MCLELLAN(RN) @16 :07 ON 01/19/13 BY Sallyanne Kuster M. Performed at [facility]   Culture NO GROWTH 3 DAYS   Final   Report Status 01/22/2013 FINAL   Final     Studies: No results found.  Scheduled Meds: . amLODipine  10 mg Oral QHS  . carvedilol  6.25 mg Oral BID WC  . cloNIDine  0.3 mg Oral BID  . docusate sodium  100 mg Oral BID  . enoxaparin (LOVENOX) injection  40 mg Subcutaneous Q24H  . lisinopril  10 mg Oral Daily  . multivitamin with minerals  1 tablet Oral Daily  . mupirocin ointment   Nasal Once  . polyethylene glycol  17 g Oral Daily   Continuous Infusions: . 0.9 % NaCl with KCl 20 mEq / L 20 mL/hr at 01/20/13 1621    Active Problems:  Severe uncontrolled hypertension   Left pilon fracture   Nonunion of fracture     LAZA,SORIN  [facility] Pager [phone]. If 8PM-8AM, please contact night-coverage at [email or web address], password [facility] 01/22/2013, 8:40 AM  LOS: 5 days

## 2013-01-22 NOTE — Progress Notes (Signed)
Subjective: 3 Days Post-Op Procedure(s) (LRB): REMOVAL EXTERNAL FIXATION LEG (Left) OPEN REDUCTION INTERNAL FIXATION (ORIF) PILON  (Left) Patient reports pain as mild.   Positive flatus no BM.  Objective: Vital signs in last 24 hours: Temp:  [98.6 F (37 C)-99 F (37.2 C)] 98.6 F (37 C) (03/02 0538) Pulse Rate:  [69-85] 69 (03/02 0538) Resp:  [18-20] 18 (03/02 0538) BP: (134-152)/(78-94) 134/85 mmHg (03/02 0538) SpO2:  [99 %-100 %] 99 % (03/02 0538)  Intake/Output from previous day: 03/01 0701 - 03/02 0700 In: 1440 [P.O.:1440] Out: 2150 [Urine:2150] Intake/Output this shift:     Recent Labs  01/20/13 0530 01/22/13 0757  HGB 11.3* 12.4*    Recent Labs  01/20/13 0530 01/22/13 0757  WBC 6.0 4.5  RBC 3.92* 4.24  HCT 33.8* 36.5*  PLT 154 187    Recent Labs  01/20/13 0530 01/22/13 0757  NA 136 136  K 4.3 4.4  CL 102 98  CO2 27 32  BUN 12 11  CREATININE 0.88 0.87  GLUCOSE 109* 133*  CALCIUM 8.6 9.9   No results found for this basename: LABPT, INR,  in the last 72 hours  Neurovascular intact Sensation intact distally Incision: dressing C/D/I Splint intact, wiggles toes  Assessment/Plan: 3 Days Post-Op Procedure(s) (LRB): REMOVAL EXTERNAL FIXATION LEG (Left) OPEN REDUCTION INTERNAL FIXATION (ORIF) PILON  (Left) Up with therapy Plan for discharge tomorrow Strongly encouraged pt up with therapy today dvt proph with lovenox Appreciate IM assistance with HTN  [place]ad[place], Joshua 01/22/2013, 10:08 AM

## 2013-01-23 DIAGNOSIS — F1910 Other psychoactive substance abuse, uncomplicated: Secondary | ICD-10-CM | POA: Diagnosis present

## 2013-01-23 MED ORDER — CLONIDINE HCL 0.3 MG PO TABS: 0.3000 mg | ORAL_TABLET | Freq: Two times a day (BID) | ORAL | Status: DC

## 2013-01-23 MED ORDER — CARVEDILOL 6.25 MG PO TABS: 6.2500 mg | ORAL_TABLET | Freq: Two times a day (BID) | ORAL | Status: DC

## 2013-01-23 MED ORDER — ADULT MULTIVITAMIN W/MINERALS CH: 1.0000 | ORAL_TABLET | Freq: Every day | ORAL | Status: DC

## 2013-01-23 MED ORDER — OXYCODONE-ACETAMINOPHEN 5-325 MG PO TABS: 1.0000 | ORAL_TABLET | Freq: Four times a day (QID) | ORAL | Status: DC | PRN

## 2013-01-23 MED ORDER — LISINOPRIL 10 MG PO TABS: 10.0000 mg | ORAL_TABLET | Freq: Every day | ORAL | Status: DC

## 2013-01-23 MED ORDER — METHOCARBAMOL 500 MG PO TABS: 500.0000 mg | ORAL_TABLET | Freq: Four times a day (QID) | ORAL | Status: DC | PRN

## 2013-01-23 MED ORDER — ACETAMINOPHEN 325 MG PO TABS: 325.0000 mg | ORAL_TABLET | Freq: Four times a day (QID) | ORAL | Status: DC | PRN

## 2013-01-23 MED ORDER — AMLODIPINE BESYLATE 10 MG PO TABS: 10.0000 mg | ORAL_TABLET | Freq: Every day | ORAL | Status: DC

## 2013-01-23 MED ORDER — DSS 100 MG PO CAPS: 100.0000 mg | ORAL_CAPSULE | Freq: Two times a day (BID) | ORAL | Status: DC

## 2013-01-23 MED ORDER — OXYCODONE HCL 5 MG PO TABS: 5.0000 mg | ORAL_TABLET | Freq: Four times a day (QID) | ORAL | Status: DC | PRN

## 2013-01-23 NOTE — Progress Notes (Signed)
[facility] ([facility])  Subjective: 4 Days Post-Op Procedure(s) (LRB): REMOVAL EXTERNAL FIXATION LEG (Left) OPEN REDUCTION INTERNAL FIXATION (ORIF) PILON  (Left)  Doing well Pain controlled BP doing much better No complaints today Ready to go home Was up yesterday CAM boot delivered to room  Objective: Current Vitals Blood pressure 148/93, pulse 69, temperature 98.8 F (37.1 C), temperature source Oral, resp. rate 18, height 5' 11.5" (1.816 m), weight 113.399 kg (250 lb), SpO2 100.00%. Vital signs in last 24 hours: Temp:  [98.8 F (37.1 C)-99.8 F (37.7 C)] 98.8 F (37.1 C) (03/03 0639) Pulse Rate:  [69-77] 69 (03/03 0639) Resp:  [18-20] 18 (03/03 0639) BP: (135-148)/(75-93) 148/93 mmHg (03/03 0639) SpO2:  [100 %] 100 % (03/03 0639)  Intake/Output from previous day: 03/02 0701 - 03/03 0700 In: 1200 [P.O.:1200] Out: 2700 [Urine:2700] Intake/Output     03/02 0701 - 03/03 0700 03/03 0701 - 03/04 0700   P.O. 1200    Total Intake(mL/kg) 1200 (10.6)    Urine (mL/kg/hr) 2700 (1)    Total Output 2700     Net -1500          Urine Occurrence 1 x       LABS  Recent Labs  01/22/13 0757  HGB 12.4*    Recent Labs  01/22/13 0757  WBC 4.5  RBC 4.24  HCT 36.5*  PLT 187    Recent Labs  01/22/13 0757  NA 136  K 4.4  CL 98  CO2 32  BUN 11  CREATININE 0.87  GLUCOSE 133*  CALCIUM 9.9   No results found for this basename: LABPT, INR,  in the last 72 hours    Physical Exam  Gen: Appears well, NAD, resting comfortably in bed  Lungs:clear Cardiac:reg Abd:+ BS, soft and NT Ext:      Left Lower Extremity  Splint fitting well  Distal motor and sensory functions intact  Ext warm  Swelling stable  Knee unremarkable     Imaging No results found.  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  Nonunion left pilon             Nonweightbearing             Up ad lib.              PT/OT             Ice and elevate             Toe and knee motion as tolerated  Pt to bring CAM boot to follow up  2. HTN urgency/Uncontrolled HTN             Appreciate medicine assistance             Continue per IM             Much improved             Added norvasc to regimen              Pt needs to establish care with PCP, will get SW involved pt lives in [place]  3. Pain          percocet and oxy IR at d/c  Robaxin for muscle spasms  4. DVT/PE prophylaxis             Mobilize  Does not need to wear foot pumps             asa 5. FEN             Heart healthy             6. misc             Urine tox screen positive for marijuana, no cocaine             TSH 0.766 uIU/mL 7. Dispo             Plan for d/c home today     Mearl Latin, PA-C [facility] [phone] (P) 01/23/2013, 8:54 AM

## 2013-01-23 NOTE — Progress Notes (Signed)
Physical Therapy Treatment Patient Details Name: Edwin Rodriguez MRN: [id number] DOB: Jul 14, 1971 Today's Date: 01/23/2013 Time: 3086-5784 PT Time Calculation (min): 14 min  PT Assessment / Plan / Recommendation Comments on Treatment Session  Pt pleasant & willing to participate.  moves fairly well.  Pt requesting to cont use of RW at this date due to does not feel comfortable/steady with crutches.  Patient declined stair training this AM. Anticipate DC soon    Follow Up Recommendations  Outpatient PT     Does the patient have the potential to tolerate intense rehabilitation     Barriers to Discharge        Equipment Recommendations       Recommendations for Other Services    Frequency Min 5X/week   Plan Discharge plan remains appropriate    Precautions / Restrictions Precautions Precautions: Fall Restrictions Weight Bearing Restrictions: Yes LLE Weight Bearing: Non weight bearing   Pertinent Vitals/Pain     Mobility  Bed Mobility Bed Mobility: Not assessed Transfers Sit to Stand: 6: Modified independent (Device/Increase time) Stand to Sit: 6: Modified independent (Device/Increase time) Ambulation/Gait Ambulation/Gait Assistance: 5: Supervision Ambulation Distance (Feet): 80 Feet Assistive device: Rolling walker Ambulation/Gait Assistance Details: Patient does not want to attempt crutches at this time Gait Pattern: Step-to pattern Stairs Assistance: Not tested (comment) Stairs Assistance Details (indicate cue type and reason): Patient declined stair training this AM. Stated that he fells comfortable with how he completes stairs as he has been NWB since Nov    Exercises     PT Diagnosis:    PT Problem List:   PT Treatment Interventions:     PT Goals Acute Rehab PT Goals PT Goal: Sit to Stand - Progress: Met PT Goal: Stand to Sit - Progress: Met PT Goal: Ambulate - Progress: Progressing toward goal  Visit Information  Last PT Received On: 01/23/13 Assistance  Needed: +1    Subjective Data      Cognition  Cognition Overall Cognitive Status: Appears within functional limits for tasks assessed/performed Arousal/Alertness: Awake/alert Orientation Level: Oriented X4 / Intact Behavior During Session: [facility] for tasks performed    Balance     End of Session PT - End of Session Equipment Utilized During Treatment: Gait belt Activity Tolerance: Patient tolerated treatment well Patient left: in chair;with call bell/phone within reach   GP     Fredrich Birks 01/23/2013, 8:21 AM 01/23/2013 Fredrich Birks PTA [phone] pager [phone] office
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
## 2013-01-23 NOTE — Progress Notes (Signed)
CARE MANAGEMENT NOTE 01/23/2013  Patient:  LUISENRIQUE, CONRAN   Account Number:  [id number]  Date Initiated:  01/20/2013  Documentation initiated by:  [facility]  Subjective/Objective Assessment:   REMOVAL EXTERNAL FIXATION LEG (Left)  OPEN REDUCTION INTERNAL FIXATION (ORIF) PILON  (Left)     Action/Plan:   waiting PT/OT eval   Anticipated DC Date:  01/23/2013   Anticipated DC Plan:  HOME W HOME HEALTH SERVICES      DC Planning Services  CM consult  [facility]      Choice offered to / List presented to:             Status of service:  Completed, signed off Medicare Important Message given?   (If response is "NO", the following Medicare IM given date fields will be blank) Date Medicare IM given:   Date Additional Medicare IM given:    Discharge Disposition:    Per UR Regulation:    If discussed at Long Length of Stay Meetings, dates discussed:    Comments:  01/23/13 11:50am Vance Peper, RN BSN Case Manager CM contacted [facility] [address] [place], [place], [place] [phone]. The intake person will contact patient in his room to confirm information and arrange appointment. Patient's contact number is [phone]

## 2013-01-23 NOTE — Progress Notes (Signed)
Patient provided with discharge instructions and follow up medications. He is aware of weight bearing status, how to use equipment, and his pain medication regimen. He is being discharged to home with his sister.

## 2013-01-24 LAB — ANAEROBIC CULTURE

## 2014-08-06 ENCOUNTER — Encounter (HOSPITAL_COMMUNITY): Payer: Self-pay | Admitting: *Deleted

## 2014-08-06 MED ORDER — CEFAZOLIN SODIUM-DEXTROSE 2-3 GM-% IV SOLR: 2.0000 g | INTRAVENOUS | Status: DC

## 2014-08-06 NOTE — Progress Notes (Signed)
Spoke to [place] at Dr. Carlean Jews office to make MD aware that pt last used cocaine on Sat. 08/04/14 along with alcohol and last used Marijuana on Sat. 07/30/14. Pt advised to stop using illicit drugs and alcohol and the consequences of noncompliance. Pt also stated that he does not take the BP medications currently listed on his med rec due to Medicaid/Medicare funding. However,  pt stated that he has a prescription for Losartan that he will get filled today and start taking tonight ( Pt stated that he hasn't taken BP meds in a couple of weeks because he ran out). Pt advised not to take that medication on the DOS. Pt denies SOB, chest pain, and being under the care of a cardiologist. Pt denies having a chest x ray and EKG within the last year. In addition, pt denies having a stress test and cardiac cath. Pt made aware to stop taking Aspirin, vitamins, and herbal medications. Do not take any NSAIDs ie: Ibuprofen, Advil, Naproxen or any medication containing Aspirin.

## 2014-08-07 ENCOUNTER — Encounter (HOSPITAL_COMMUNITY): Payer: Self-pay | Admitting: Emergency Medicine

## 2014-08-07 ENCOUNTER — Ambulatory Visit (HOSPITAL_COMMUNITY): Payer: Medicaid Other

## 2014-08-07 ENCOUNTER — Encounter (HOSPITAL_COMMUNITY)
Admission: RE | Disposition: A | Discharge status: Home / Self Care | Payer: Self-pay | Source: Ambulatory Visit | Attending: Internal Medicine

## 2014-08-07 ENCOUNTER — Inpatient Hospital Stay (HOSPITAL_COMMUNITY)
Admission: RE | Admit: 2014-08-07 | Discharge: 2014-08-10 | DRG: 982 | Disposition: A | Discharge status: Home / Self Care | Payer: Medicaid Other | Source: Ambulatory Visit | Attending: Internal Medicine | Admitting: Internal Medicine

## 2014-08-07 ENCOUNTER — Emergency Department (HOSPITAL_COMMUNITY)
Admission: EM | Admit: 2014-08-07 | Discharge: 2014-08-07 | Discharge status: Absent without leave | Payer: Medicaid Other | Source: Home / Self Care

## 2014-08-07 DIAGNOSIS — F1010 Alcohol abuse, uncomplicated: Secondary | ICD-10-CM | POA: Diagnosis present

## 2014-08-07 DIAGNOSIS — Z86711 Personal history of pulmonary embolism: Secondary | ICD-10-CM

## 2014-08-07 DIAGNOSIS — N182 Chronic kidney disease, stage 2 (mild): Secondary | ICD-10-CM

## 2014-08-07 DIAGNOSIS — S82202S Unspecified fracture of shaft of left tibia, sequela: Secondary | ICD-10-CM

## 2014-08-07 DIAGNOSIS — S82402S Unspecified fracture of shaft of left fibula, sequela: Secondary | ICD-10-CM

## 2014-08-07 DIAGNOSIS — N179 Acute kidney failure, unspecified: Secondary | ICD-10-CM

## 2014-08-07 DIAGNOSIS — I10 Essential (primary) hypertension: Secondary | ICD-10-CM

## 2014-08-07 DIAGNOSIS — T8484XD Pain due to internal orthopedic prosthetic devices, implants and grafts, subsequent encounter: Secondary | ICD-10-CM

## 2014-08-07 DIAGNOSIS — H538 Other visual disturbances: Secondary | ICD-10-CM | POA: Diagnosis present

## 2014-08-07 DIAGNOSIS — Y838 Other surgical procedures as the cause of abnormal reaction of the patient, or of later complication, without mention of misadventure at the time of the procedure: Secondary | ICD-10-CM | POA: Diagnosis present

## 2014-08-07 DIAGNOSIS — T8484XA Pain due to internal orthopedic prosthetic devices, implants and grafts, initial encounter: Secondary | ICD-10-CM | POA: Diagnosis present

## 2014-08-07 DIAGNOSIS — I129 Hypertensive chronic kidney disease with stage 1 through stage 4 chronic kidney disease, or unspecified chronic kidney disease: Principal | ICD-10-CM | POA: Diagnosis present

## 2014-08-07 DIAGNOSIS — F1410 Cocaine abuse, uncomplicated: Secondary | ICD-10-CM

## 2014-08-07 DIAGNOSIS — M542 Cervicalgia: Secondary | ICD-10-CM | POA: Diagnosis present

## 2014-08-07 DIAGNOSIS — Z9119 Patient's noncompliance with other medical treatment and regimen: Secondary | ICD-10-CM

## 2014-08-07 DIAGNOSIS — F1210 Cannabis abuse, uncomplicated: Secondary | ICD-10-CM | POA: Diagnosis present

## 2014-08-07 DIAGNOSIS — I161 Hypertensive emergency: Secondary | ICD-10-CM

## 2014-08-07 DIAGNOSIS — F1910 Other psychoactive substance abuse, uncomplicated: Secondary | ICD-10-CM | POA: Diagnosis present

## 2014-08-07 DIAGNOSIS — N189 Chronic kidney disease, unspecified: Secondary | ICD-10-CM | POA: Diagnosis present

## 2014-08-07 DIAGNOSIS — Z86718 Personal history of other venous thrombosis and embolism: Secondary | ICD-10-CM

## 2014-08-07 DIAGNOSIS — F17200 Nicotine dependence, unspecified, uncomplicated: Secondary | ICD-10-CM | POA: Diagnosis present

## 2014-08-07 DIAGNOSIS — T8489XA Other specified complication of internal orthopedic prosthetic devices, implants and grafts, initial encounter: Secondary | ICD-10-CM | POA: Diagnosis present

## 2014-08-07 DIAGNOSIS — I517 Cardiomegaly: Secondary | ICD-10-CM | POA: Diagnosis present

## 2014-08-07 DIAGNOSIS — Z91199 Patient's noncompliance with other medical treatment and regimen due to unspecified reason: Secondary | ICD-10-CM

## 2014-08-07 HISTORY — DX: Personal history of other medical treatment: Z92.89

## 2014-08-07 LAB — RAPID URINE DRUG SCREEN, HOSP PERFORMED
Amphetamines: NOT DETECTED
BENZODIAZEPINES: NOT DETECTED
Barbiturates: NOT DETECTED
Cocaine: POSITIVE — AB
OPIATES: NOT DETECTED
Tetrahydrocannabinol: NOT DETECTED

## 2014-08-07 LAB — COMPREHENSIVE METABOLIC PANEL
ALBUMIN: 3.8 g/dL (ref 3.5–5.2)
ALT: 25 U/L (ref 0–53)
AST: 24 U/L (ref 0–37)
Alkaline Phosphatase: 77 U/L (ref 39–117)
Anion gap: 12 (ref 5–15)
BILIRUBIN TOTAL: 0.5 mg/dL (ref 0.3–1.2)
BUN: 18 mg/dL (ref 6–23)
CALCIUM: 9.4 mg/dL (ref 8.4–10.5)
CHLORIDE: 104 meq/L (ref 96–112)
CO2: 26 mEq/L (ref 19–32)
CREATININE: 1.37 mg/dL — AB (ref 0.50–1.35)
GFR calc Af Amer: 72 mL/min — ABNORMAL LOW (ref >90)
GFR calc non Af Amer: 62 mL/min — ABNORMAL LOW (ref >90)
Glucose, Bld: 89 mg/dL (ref 70–99)
Potassium: 4.2 mEq/L (ref 3.7–5.3)
Sodium: 142 mEq/L (ref 137–147)
Total Protein: 7.7 g/dL (ref 6.0–8.3)

## 2014-08-07 LAB — CBC
HCT: 43 % (ref 39.0–52.0)
Hemoglobin: 15 g/dL (ref 13.0–17.0)
MCH: 32.8 pg (ref 26.0–34.0)
MCHC: 34.9 g/dL (ref 30.0–36.0)
MCV: 94.1 fL (ref 78.0–100.0)
Platelets: 156 10*3/uL (ref 150–400)
RBC: 4.57 MIL/uL (ref 4.22–5.81)
RDW: 14.1 % (ref 11.5–15.5)
WBC: 4.6 10*3/uL (ref 4.0–10.5)

## 2014-08-07 LAB — PRO B NATRIURETIC PEPTIDE: Pro B Natriuretic peptide (BNP): 1014 pg/mL — ABNORMAL HIGH (ref 0–125)

## 2014-08-07 LAB — TROPONIN I: Troponin I: <0.3 ng/mL (ref <0.30)

## 2014-08-07 SURGERY — REMOVAL, HARDWARE
Anesthesia: General | Site: Ankle | Laterality: Left

## 2014-08-07 MED ORDER — LIDOCAINE HCL (CARDIAC) 20 MG/ML IV SOLN
INTRAVENOUS | Status: AC
  Filled 2014-08-07: qty 5

## 2014-08-07 MED ORDER — FENTANYL CITRATE 0.05 MG/ML IJ SOLN
50.0000 ug | Freq: Once | INTRAMUSCULAR | Status: AC
  Administered 2014-08-07: 50 ug via INTRAVENOUS
  Filled 2014-08-07: qty 2

## 2014-08-07 MED ORDER — HEPARIN SODIUM (PORCINE) 5000 UNIT/ML IJ SOLN
5000.0000 [IU] | Freq: Three times a day (TID) | INTRAMUSCULAR | Status: DC
  Administered 2014-08-07 – 2014-08-08 (×4): 5000 [IU] via SUBCUTANEOUS
  Filled 2014-08-07 (×9): qty 1

## 2014-08-07 MED ORDER — SODIUM CHLORIDE 0.9 % IJ SOLN
3.0000 mL | Freq: Two times a day (BID) | INTRAMUSCULAR | Status: DC
  Administered 2014-08-07 – 2014-08-10 (×5): 3 mL via INTRAVENOUS

## 2014-08-07 MED ORDER — FOLIC ACID 1 MG PO TABS
1.0000 mg | ORAL_TABLET | Freq: Every day | ORAL | Status: DC
  Administered 2014-08-07 – 2014-08-10 (×3): 1 mg via ORAL
  Filled 2014-08-07 (×4): qty 1

## 2014-08-07 MED ORDER — AMLODIPINE BESYLATE 5 MG PO TABS
5.0000 mg | ORAL_TABLET | Freq: Every day | ORAL | Status: DC
  Administered 2014-08-07: 5 mg via ORAL
  Filled 2014-08-07 (×2): qty 1

## 2014-08-07 MED ORDER — STERILE WATER FOR INJECTION IJ SOLN
INTRAMUSCULAR | Status: AC
  Filled 2014-08-07: qty 10

## 2014-08-07 MED ORDER — HYDRALAZINE HCL 20 MG/ML IJ SOLN
5.0000 mg | Freq: Once | INTRAMUSCULAR | Status: AC
  Administered 2014-08-07: 5 mg via INTRAVENOUS
  Filled 2014-08-07: qty 1

## 2014-08-07 MED ORDER — HYDRALAZINE HCL 20 MG/ML IJ SOLN
10.0000 mg | Freq: Once | INTRAMUSCULAR | Status: AC
  Administered 2014-08-07: 10 mg via INTRAVENOUS

## 2014-08-07 MED ORDER — MIDAZOLAM HCL 2 MG/2ML IJ SOLN
INTRAMUSCULAR | Status: AC
  Filled 2014-08-07: qty 2

## 2014-08-07 MED ORDER — ONDANSETRON HCL 4 MG/2ML IJ SOLN
INTRAMUSCULAR | Status: AC
  Filled 2014-08-07: qty 2

## 2014-08-07 MED ORDER — CHLORHEXIDINE GLUCONATE 4 % EX LIQD: 60.0000 mL | Freq: Once | CUTANEOUS | Status: DC

## 2014-08-07 MED ORDER — ADULT MULTIVITAMIN W/MINERALS CH
1.0000 | ORAL_TABLET | Freq: Every day | ORAL | Status: DC
  Administered 2014-08-07 – 2014-08-10 (×3): 1 via ORAL
  Filled 2014-08-07 (×4): qty 1

## 2014-08-07 MED ORDER — EPHEDRINE SULFATE 50 MG/ML IJ SOLN
INTRAMUSCULAR | Status: AC
  Filled 2014-08-07: qty 1

## 2014-08-07 MED ORDER — ARTIFICIAL TEARS OP OINT
TOPICAL_OINTMENT | OPHTHALMIC | Status: AC
  Filled 2014-08-07: qty 3.5

## 2014-08-07 MED ORDER — ACETAMINOPHEN 500 MG PO TABS: 1000.0000 mg | ORAL_TABLET | Freq: Once | ORAL | Status: DC

## 2014-08-07 MED ORDER — THIAMINE HCL 100 MG/ML IJ SOLN
100.0000 mg | Freq: Every day | INTRAMUSCULAR | Status: DC
  Filled 2014-08-07 (×2): qty 1

## 2014-08-07 MED ORDER — ROCURONIUM BROMIDE 50 MG/5ML IV SOLN
INTRAVENOUS | Status: AC
  Filled 2014-08-07: qty 1

## 2014-08-07 MED ORDER — HYDRALAZINE HCL 20 MG/ML IJ SOLN
10.0000 mg | Freq: Once | INTRAMUSCULAR | Status: AC
  Administered 2014-08-07: 10 mg via INTRAVENOUS
  Filled 2014-08-07: qty 1

## 2014-08-07 MED ORDER — LACTATED RINGERS IV SOLN: INTRAVENOUS | Status: DC

## 2014-08-07 MED ORDER — PROPOFOL 10 MG/ML IV BOLUS
INTRAVENOUS | Status: AC
  Filled 2014-08-07: qty 20

## 2014-08-07 MED ORDER — VITAMIN B-1 100 MG PO TABS
100.0000 mg | ORAL_TABLET | Freq: Every day | ORAL | Status: DC
  Administered 2014-08-07 – 2014-08-10 (×3): 100 mg via ORAL
  Filled 2014-08-07 (×4): qty 1

## 2014-08-07 MED ORDER — FENTANYL CITRATE 0.05 MG/ML IJ SOLN
INTRAMUSCULAR | Status: AC
  Filled 2014-08-07: qty 5

## 2014-08-07 SURGICAL SUPPLY — 60 items
BANDAGE ELASTIC 4 VELCRO ST LF (GAUZE/BANDAGES/DRESSINGS) ×3 IMPLANT
BANDAGE ELASTIC 6 VELCRO ST LF (GAUZE/BANDAGES/DRESSINGS) ×3 IMPLANT
BANDAGE ESMARK 6X9 LF (GAUZE/BANDAGES/DRESSINGS) ×1 IMPLANT
BNDG COHESIVE 6X5 TAN STRL LF (GAUZE/BANDAGES/DRESSINGS) ×3 IMPLANT
BNDG ESMARK 6X9 LF (GAUZE/BANDAGES/DRESSINGS) ×3
BNDG GAUZE ELAST 4 BULKY (GAUZE/BANDAGES/DRESSINGS) ×6 IMPLANT
BRUSH SCRUB DISP (MISCELLANEOUS) ×6 IMPLANT
CLEANER TIP ELECTROSURG 2X2 (MISCELLANEOUS) ×3 IMPLANT
CLOSURE WOUND 1/2 X4 (GAUZE/BANDAGES/DRESSINGS)
COVER SURGICAL LIGHT HANDLE (MISCELLANEOUS) ×6 IMPLANT
CUFF TOURNIQUET SINGLE 18IN (TOURNIQUET CUFF) IMPLANT
CUFF TOURNIQUET SINGLE 24IN (TOURNIQUET CUFF) IMPLANT
CUFF TOURNIQUET SINGLE 34IN LL (TOURNIQUET CUFF) IMPLANT
DRAPE C-ARM 42X72 X-RAY (DRAPES) IMPLANT
DRAPE C-ARMOR (DRAPES) ×3 IMPLANT
DRAPE OEC MINIVIEW 54X84 (DRAPES) ×3 IMPLANT
DRAPE U-SHAPE 47X51 STRL (DRAPES) ×3 IMPLANT
DRSG ADAPTIC 3X8 NADH LF (GAUZE/BANDAGES/DRESSINGS) ×3 IMPLANT
ELECT REM PT RETURN 9FT ADLT (ELECTROSURGICAL) ×3
ELECTRODE REM PT RTRN 9FT ADLT (ELECTROSURGICAL) ×1 IMPLANT
EVACUATOR 1/8 PVC DRAIN (DRAIN) IMPLANT
GAUZE SPONGE 4X4 12PLY STRL (GAUZE/BANDAGES/DRESSINGS) ×3 IMPLANT
GLOVE BIO SURGEON STRL SZ7.5 (GLOVE) ×3 IMPLANT
GLOVE BIO SURGEON STRL SZ8 (GLOVE) ×3 IMPLANT
GLOVE BIOGEL PI IND STRL 7.5 (GLOVE) ×1 IMPLANT
GLOVE BIOGEL PI IND STRL 8 (GLOVE) ×1 IMPLANT
GLOVE BIOGEL PI INDICATOR 7.5 (GLOVE) ×2
GLOVE BIOGEL PI INDICATOR 8 (GLOVE) ×2
GOWN STRL REUS W/ TWL LRG LVL3 (GOWN DISPOSABLE) ×2 IMPLANT
GOWN STRL REUS W/ TWL XL LVL3 (GOWN DISPOSABLE) ×1 IMPLANT
GOWN STRL REUS W/TWL LRG LVL3 (GOWN DISPOSABLE) ×4
GOWN STRL REUS W/TWL XL LVL3 (GOWN DISPOSABLE) ×2
KIT BASIN OR (CUSTOM PROCEDURE TRAY) ×3 IMPLANT
KIT ROOM TURNOVER OR (KITS) ×3 IMPLANT
MANIFOLD NEPTUNE II (INSTRUMENTS) ×3 IMPLANT
NEEDLE 22X1 1/2 (OR ONLY) (NEEDLE) IMPLANT
NS IRRIG 1000ML POUR BTL (IV SOLUTION) ×3 IMPLANT
PACK ORTHO EXTREMITY (CUSTOM PROCEDURE TRAY) ×3 IMPLANT
PAD ARMBOARD 7.5X6 YLW CONV (MISCELLANEOUS) ×6 IMPLANT
PADDING CAST COTTON 6X4 STRL (CAST SUPPLIES) ×9 IMPLANT
SPONGE LAP 18X18 X RAY DECT (DISPOSABLE) ×3 IMPLANT
SPONGE SCRUB IODOPHOR (GAUZE/BANDAGES/DRESSINGS) ×3 IMPLANT
STAPLER VISISTAT 35W (STAPLE) IMPLANT
STOCKINETTE IMPERVIOUS LG (DRAPES) ×3 IMPLANT
STRIP CLOSURE SKIN 1/2X4 (GAUZE/BANDAGES/DRESSINGS) IMPLANT
SUCTION FRAZIER TIP 10 FR DISP (SUCTIONS) IMPLANT
SUT ETHILON 3 0 PS 1 (SUTURE) IMPLANT
SUT PDS AB 2-0 CT1 27 (SUTURE) IMPLANT
SUT VIC AB 0 CT1 27 (SUTURE)
SUT VIC AB 0 CT1 27XBRD ANBCTR (SUTURE) IMPLANT
SUT VIC AB 2-0 CT1 27 (SUTURE)
SUT VIC AB 2-0 CT1 TAPERPNT 27 (SUTURE) IMPLANT
SYR CONTROL 10ML LL (SYRINGE) IMPLANT
TOWEL OR 17X24 6PK STRL BLUE (TOWEL DISPOSABLE) ×6 IMPLANT
TOWEL OR 17X26 10 PK STRL BLUE (TOWEL DISPOSABLE) ×6 IMPLANT
TUBE CONNECTING 12'X1/4 (SUCTIONS) ×1
TUBE CONNECTING 12X1/4 (SUCTIONS) ×2 IMPLANT
UNDERPAD 30X30 INCONTINENT (UNDERPADS AND DIAPERS) ×3 IMPLANT
WATER STERILE IRR 1000ML POUR (IV SOLUTION) ×6 IMPLANT
YANKAUER SUCT BULB TIP NO VENT (SUCTIONS) ×3 IMPLANT
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
## 2014-08-07 NOTE — ED Notes (Addendum)
PT ran out of BP meds 3 weeks ago; losartin. Pt got it filled yesterday and took 2 pills around 2000 last night. He took 2 bc he said he thought that would be helpful since has not had in while. Dr. Harmon Pier in [place], pain clinic MD was prescriber. NSR on monitor; denies chest pain or SOB

## 2014-08-07 NOTE — ED Notes (Signed)
admitting at bedside.  

## 2014-08-07 NOTE — ED Notes (Signed)
Sandwich and meal given. Updated on POC.

## 2014-08-07 NOTE — Progress Notes (Signed)
On call physician notified of pt's manual BP of 178/95. Pt in no s/s of distress.

## 2014-08-07 NOTE — H&P (Addendum)
Pt seen and examined with Dr. Sherrine Maples. Please refer to resident note for details  In brief, pt is a 43 y/o male with PMH of HTN, PE, polysubstance abuse (cocaine, ETOH, marijuana) who presents with hypertensive emergency to the ED. Pt states that he has not taken his BP meds in approx 3 weeks as he had run out of pills. He did refill his losartan/HCTZ last night and took that one yesterday. He was scheduled for an elective ortho procedure today to have hardware removed from his leg but found to have elevated BP- 240/169 and pt was sent to ED for further w/u. Pt also c/o HA, blurred vision * 1 week. No CP, no SOB, no palpitations, no diaphoresis, no lightheadedness, no syncope, no fevers/chills, no cough, no lightheadedness/syncope, no fevers/chills, no tingling/numbness, no focal weakness. Remaining ROS negative.  Exam: Cardio: RRR,normal heart sounds Lungs: CTA b/l Abd: soft, non tender, BS + Ext: no edema Gen: AAO*3, NAD Neuro: no focal deficits  Assessment and Plan: 43 y/o male with hypertensive emergency  Hypertensive emergency: -Avoid too fast a drop in BP given pt with likely elevated BP over last 3 weeks - Avoid betablockers given recent cocaine use -3 days ago -Will start norvasc 5 mg for now and titrate up tomorrow - Avoid diuretics/ACE-I/ARB for now given AKI - would give hydralazine prn for SBP >180 - monitor on tele - f/u 2 D ECHO - may need w/u for secondary causes of HTN if persistently elevated BP despite adequate meds  AKI: - Likely secondary to uncontrolled HTN - Would calculate FeNa, check u/a - consider renal sono if no improvement  Polysubstance abuse: - pt counseled on dangers of cocaine use - avoid beta blockers - Will need counseling/rehab as outpatient

## 2014-08-07 NOTE — Progress Notes (Signed)
Pt evaluated for surgery today.  Reports that he has not taken his Bp meds for several weeks.  Bp is extremely high today with diastolic pressures in excess of 150 mm Hg. Discussed with Dr. Marcelino Scot, Hospitalist on call, and the ER physicians who feel the patient should be admitted to the ER for treatment and possible admission.  I discussed this plan with the patient who agrees.  Tamela Gammon, MD

## 2014-08-07 NOTE — ED Notes (Signed)
Crackers and drink given per EDP

## 2014-08-07 NOTE — Progress Notes (Addendum)
On call physician notified of pt's manual BP of 160/110. Pt complaining of headache 8 out of 10 - no prn pain med ordered. Pt on CIWA - scored at 5 - no prn ativan ordered. On call physician state okay with pt's BP at this time - will continue to monitor. PO tylenol x 1 dose ordered. On call physician state no prn ativan for CIWA at this time - asked nurse to notify if CIWA scored exceeds 8.

## 2014-08-07 NOTE — ED Notes (Signed)
MD at bedside. 

## 2014-08-07 NOTE — Progress Notes (Addendum)
Upon arrival to short stay patient bp 237/178. Patient states he has not taken his bp medications in several weeks and has had a headache and blurred vision this past week. Notified dr. Tobias Alexander of this and he stated he would talk with dr. Marcelino Scot as to what needed to be done next.

## 2014-08-07 NOTE — ED Notes (Signed)
Patient transported to X-ray 

## 2014-08-07 NOTE — Progress Notes (Signed)
Orthopaedic Progress Note  Made aware of pts recent cocaine and marijuana use as well as noncompliance with BP meds.  The procedure he is scheduled for is completely elective. Will review vitals today on arrival to short stay and proceed accordingly. Also defer to anesthesiologist in terms of proceeding given recent drug use.  Please call on pts arrival to make aware of BP/vitals  Jari Pigg, PA-C [facility] [phone] (P) 08/07/2014 8:37 AM

## 2014-08-07 NOTE — ED Notes (Signed)
Meal tray given to pt.

## 2014-08-07 NOTE — ED Provider Notes (Signed)
CSN: [id number]     Arrival date & time 08/07/14  1047 History   First MD Initiated Contact with Patient 08/07/14 1107     Chief Complaint  Patient presents with  . Hypertension     (Consider location/radiation/quality/duration/timing/severity/associated sxs/prior Treatment) Patient is a 43 y.o. male presenting with hypertension. The history is provided by the patient.  Hypertension This is a recurrent problem. Associated symptoms include headaches. Pertinent negatives include no chest pain, no abdominal pain and no shortness of breath.   patient was sent from preop with hypertension. Pressure reportedly 240/160. Patient states he is a dull headache. States he has been off his medications until recently because she was having insurance issues. No chest pain. No trouble breathing. He was due to her hardware taken out of his leg. His blood pressure prescriptions had come from his pain medicine Dr. No primary care Dr. No chest pain. No lightheadedness or dizziness.  Past Medical History  Diagnosis Date  . DVT (deep venous thrombosis)   . PE (pulmonary embolism)   . Osteomyelitis of left leg   . Hypertension     "does not have primary doctor at this time"  . Chronic kidney disease     "borderline"  . Fracture of leg     left lower  . Arthritis    Past Surgical History  Procedure Laterality Date  . Knee surgery    . Hip surgery    . Orif left distal femur   late 1980's  . Right femur orif    . Removal of hardware due to infection left femur    . Orif tibia fracture  10/11/2012    Procedure: OPEN REDUCTION INTERNAL FIXATION (ORIF) TIBIA FRACTURE;  Surgeon: Rozanna Box, MD;  Location: [place];  Service: Orthopedics;  Laterality: Left;  . I&d extremity  10/14/2012    Procedure: IRRIGATION AND DEBRIDEMENT EXTREMITY;  Surgeon: Rozanna Box, MD;  Location: [place];  Service: Orthopedics;  Laterality: Left;  with vac change   . Skin split graft  10/17/2012    Procedure: SKIN GRAFT  SPLIT THICKNESS;  Surgeon: Rozanna Box, MD;  Location: [place];  Service: Orthopedics;  Laterality: Left;  Split thickness skin graft left lower leg from Right Thigh  . Application of wound vac  10/17/2012    Procedure: APPLICATION OF WOUND VAC;  Surgeon: Rozanna Box, MD;  Location: [place];  Service: Orthopedics;  Laterality: Left;  re-application of wound vac medial aspect of left lower leg  . External fixation removal Left 01/19/2013    Procedure: REMOVAL EXTERNAL FIXATION LEG;  Surgeon: Rozanna Box, MD;  Location: [place];  Service: Orthopedics;  Laterality: Left;  . Orif ankle fracture Left 01/19/2013    Procedure: OPEN REDUCTION INTERNAL FIXATION (ORIF) PILON ;  Surgeon: Rozanna Box, MD;  Location: [place];  Service: Orthopedics;  Laterality: Left;  Repair of tibial shaft non union left; Partial excision of bone for osteomyelitis.   Family History  Problem Relation Age of Onset  . Hypertension Other    History  Substance Use Topics  . Smoking status: Current Every Day Smoker -- 0.50 packs/day for 10 years    Types: Cigarettes  . Smokeless tobacco: Never Used  . Alcohol Use: Yes     Comment: socially    Review of Systems  Constitutional: Negative for activity change and appetite change.  Eyes: Negative for pain.  Respiratory: Negative for chest tightness and shortness of breath.  Cardiovascular: Negative for chest pain and leg swelling.  Gastrointestinal: Negative for nausea, vomiting, abdominal pain and diarrhea.  Genitourinary: Negative for flank pain.  Musculoskeletal: Negative for back pain and neck stiffness.  Skin: Negative for rash.  Neurological: Positive for headaches. Negative for weakness and numbness.  Psychiatric/Behavioral: Negative for behavioral problems.      Allergies  Review of patient's allergies indicates no known allergies.  Home Medications   Prior to Admission medications   Medication Sig Start Date End Date Taking? Authorizing Provider   losartan-hydrochlorothiazide (HYZAAR) 50-12.5 MG per tablet Take 1 tablet by mouth daily.   Yes Historical Provider, MD  naproxen (NAPROSYN) 500 MG tablet Take 500 mg by mouth 2 (two) times daily with a meal.   Yes Historical Provider, MD   BP 185/121  Pulse 71  Temp(Src) 97.1 F (36.2 C) (Oral)  Resp 21  Ht 5\' 11"  (1.803 m)  Wt 260 lb (117.935 kg)  BMI 36.28 kg/m2  SpO2 96% Physical Exam  Nursing note and vitals reviewed. Constitutional: He is oriented to person, place, and time. He appears well-developed and well-nourished.  HENT:  Head: Normocephalic and atraumatic.  Eyes: EOM are normal. Pupils are equal, round, and reactive to light.  Neck: Normal range of motion. Neck supple.  Cardiovascular: Normal rate, regular rhythm and normal heart sounds.   No murmur heard. Pulmonary/Chest: Effort normal and breath sounds normal.  Abdominal: Soft. Bowel sounds are normal. He exhibits no distension and no mass. There is no tenderness. There is no rebound and no guarding.  Musculoskeletal: Normal range of motion. He exhibits tenderness. He exhibits no edema.  Some tenderness to left lower leg. Previous skin grafts  Neurological: He is alert and oriented to person, place, and time. No cranial nerve deficit.  Skin: Skin is warm and dry.  Psychiatric: He has a normal mood and affect.    ED Course  Procedures (including critical care time) Labs Review Labs Reviewed  URINE RAPID DRUG SCREEN (HOSP PERFORMED) - Abnormal; Notable for the following:    Cocaine POSITIVE (*)    All other components within normal limits  COMPREHENSIVE METABOLIC PANEL - Abnormal; Notable for the following:    Creatinine, Ser 1.37 (*)    GFR calc non Af Amer 62 (*)    GFR calc Af Amer 72 (*)    All other components within normal limits  PRO B NATRIURETIC PEPTIDE - Abnormal; Notable for the following:    Pro B Natriuretic peptide (BNP) 1014.0 (*)    All other components within normal limits  CBC  TROPONIN I     Imaging Review Dg Chest 2 View  08/07/2014   CLINICAL DATA:  Hypertension  EXAM: CHEST  2 VIEW  COMPARISON:  10/11/2012  FINDINGS: Cardiomediastinal silhouette is stable. No acute infiltrate or pleural effusion. No pulmonary edema. Bony thorax is unremarkable.  IMPRESSION: No active cardiopulmonary disease.   Electronically Signed   By: Lahoma Crocker M.D.   On: 08/07/2014 12:21     EKG Interpretation None      MDM   Final diagnoses:  Essential hypertension  Substance abuse    Patient with hypertension. EKG is stable. Enzymes negative. History of noncompliance and also cocaine use. No PCP. Will admit to internal medicine    Jasper Riling. Alvino Chapel, MD 08/07/14 [phone]

## 2014-08-07 NOTE — ED Notes (Signed)
Pt returned from xray

## 2014-08-07 NOTE — ED Notes (Signed)
MD aware of pt's pressure and returning headache.

## 2014-08-08 ENCOUNTER — Inpatient Hospital Stay (HOSPITAL_COMMUNITY): Payer: Medicaid Other

## 2014-08-08 ENCOUNTER — Encounter (HOSPITAL_COMMUNITY): Payer: Self-pay | Admitting: Radiology

## 2014-08-08 DIAGNOSIS — I517 Cardiomegaly: Secondary | ICD-10-CM | POA: Diagnosis present

## 2014-08-08 DIAGNOSIS — F1410 Cocaine abuse, uncomplicated: Secondary | ICD-10-CM | POA: Diagnosis present

## 2014-08-08 DIAGNOSIS — I10 Essential (primary) hypertension: Secondary | ICD-10-CM | POA: Diagnosis present

## 2014-08-08 DIAGNOSIS — F17200 Nicotine dependence, unspecified, uncomplicated: Secondary | ICD-10-CM | POA: Diagnosis present

## 2014-08-08 DIAGNOSIS — F1010 Alcohol abuse, uncomplicated: Secondary | ICD-10-CM | POA: Diagnosis present

## 2014-08-08 DIAGNOSIS — I129 Hypertensive chronic kidney disease with stage 1 through stage 4 chronic kidney disease, or unspecified chronic kidney disease: Secondary | ICD-10-CM | POA: Diagnosis present

## 2014-08-08 DIAGNOSIS — Z86718 Personal history of other venous thrombosis and embolism: Secondary | ICD-10-CM | POA: Diagnosis not present

## 2014-08-08 DIAGNOSIS — N189 Chronic kidney disease, unspecified: Secondary | ICD-10-CM | POA: Diagnosis present

## 2014-08-08 DIAGNOSIS — F1210 Cannabis abuse, uncomplicated: Secondary | ICD-10-CM | POA: Diagnosis present

## 2014-08-08 DIAGNOSIS — M542 Cervicalgia: Secondary | ICD-10-CM | POA: Diagnosis present

## 2014-08-08 DIAGNOSIS — H538 Other visual disturbances: Secondary | ICD-10-CM | POA: Diagnosis present

## 2014-08-08 DIAGNOSIS — Y838 Other surgical procedures as the cause of abnormal reaction of the patient, or of later complication, without mention of misadventure at the time of the procedure: Secondary | ICD-10-CM | POA: Diagnosis present

## 2014-08-08 DIAGNOSIS — Z91199 Patient's noncompliance with other medical treatment and regimen due to unspecified reason: Secondary | ICD-10-CM | POA: Diagnosis not present

## 2014-08-08 DIAGNOSIS — Z86711 Personal history of pulmonary embolism: Secondary | ICD-10-CM | POA: Diagnosis not present

## 2014-08-08 DIAGNOSIS — T8489XA Other specified complication of internal orthopedic prosthetic devices, implants and grafts, initial encounter: Secondary | ICD-10-CM | POA: Diagnosis present

## 2014-08-08 LAB — BASIC METABOLIC PANEL
ANION GAP: 13 (ref 5–15)
BUN: 20 mg/dL (ref 6–23)
CALCIUM: 9.1 mg/dL (ref 8.4–10.5)
CO2: 26 mEq/L (ref 19–32)
Chloride: 104 mEq/L (ref 96–112)
Creatinine, Ser: 1.36 mg/dL — ABNORMAL HIGH (ref 0.50–1.35)
GFR calc non Af Amer: 63 mL/min — ABNORMAL LOW (ref >90)
GFR, EST AFRICAN AMERICAN: 73 mL/min — AB (ref >90)
Glucose, Bld: 87 mg/dL (ref 70–99)
Potassium: 4.3 mEq/L (ref 3.7–5.3)
Sodium: 143 mEq/L (ref 137–147)

## 2014-08-08 LAB — SODIUM, URINE, RANDOM: Sodium, Ur: 117 mEq/L

## 2014-08-08 LAB — TROPONIN I: Troponin I: <0.3 ng/mL (ref <0.30)

## 2014-08-08 LAB — CREATININE, URINE, RANDOM: Creatinine, Urine: 115 mg/dL

## 2014-08-08 MED ORDER — HYDRALAZINE HCL 20 MG/ML IJ SOLN
5.0000 mg | Freq: Three times a day (TID) | INTRAMUSCULAR | Status: DC | PRN
  Administered 2014-08-08: 5 mg via INTRAVENOUS
  Filled 2014-08-08 (×2): qty 1

## 2014-08-08 MED ORDER — IOHEXOL 300 MG/ML  SOLN
100.0000 mL | Freq: Once | INTRAMUSCULAR | Status: AC | PRN
  Administered 2014-08-08: 100 mL via INTRAVENOUS

## 2014-08-08 MED ORDER — CEFAZOLIN SODIUM-DEXTROSE 2-3 GM-% IV SOLR
2.0000 g | Freq: Once | INTRAVENOUS | Status: AC
  Administered 2014-08-09: 2 g via INTRAVENOUS
  Filled 2014-08-08 (×2): qty 50

## 2014-08-08 MED ORDER — SODIUM CHLORIDE 0.9 % IV BOLUS (SEPSIS)
500.0000 mL | Freq: Once | INTRAVENOUS | Status: AC
  Administered 2014-08-08: 500 mL via INTRAVENOUS

## 2014-08-08 MED ORDER — ACETAMINOPHEN 325 MG PO TABS
650.0000 mg | ORAL_TABLET | Freq: Once | ORAL | Status: AC
  Administered 2014-08-08: 650 mg via ORAL
  Filled 2014-08-08: qty 2

## 2014-08-08 MED ORDER — ACETAMINOPHEN 325 MG PO TABS
650.0000 mg | ORAL_TABLET | Freq: Four times a day (QID) | ORAL | Status: DC | PRN
  Administered 2014-08-08: 650 mg via ORAL
  Filled 2014-08-08 (×2): qty 2

## 2014-08-08 MED ORDER — AMLODIPINE BESYLATE 10 MG PO TABS
10.0000 mg | ORAL_TABLET | Freq: Every day | ORAL | Status: DC
  Administered 2014-08-08 – 2014-08-10 (×2): 10 mg via ORAL
  Filled 2014-08-08 (×3): qty 1

## 2014-08-08 NOTE — Progress Notes (Addendum)
On call physician notified of pt's manual BP 175/110. Pt in no s/s of distress. No new orders received.

## 2014-08-08 NOTE — Progress Notes (Signed)
Utilization Review Completed.Shamari Lofquist T9/16/2015  

## 2014-08-08 NOTE — Progress Notes (Signed)
Physician paged for pt's manual BP 179/95. Pt in no s/s of distress.

## 2014-08-08 NOTE — Progress Notes (Signed)
Echocardiogram 2D Echocardiogram has been performed.  Edwin Rodriguez 08/08/2014, 9:43 AM

## 2014-08-08 NOTE — Progress Notes (Signed)
Subjective: Edwin Rodriguez feels good this morning. His headache overnight subsided with tylenol. His dizziness and blurred vision have not returned. He continue to deny chest pain, shortness of breath or epistaxis. He is interested in staying overnight to receive his orthopaedic surgery tomorrow morning.  He required hydralazine at 8:30PM (5mg ) for a SBP > 180.   Objective: Vital signs in last 24 hours: Filed Vitals:   08/07/14 2350 08/08/14 0300 08/08/14 0654 08/08/14 0703  BP: 160/110 146/80 165/102 170/95  Pulse:  80 79   Temp:   98.7 F (37.1 C)   TempSrc:   Oral   Resp:  20 20   Height:      Weight:      SpO2:  98% 97%    Weight change:   Intake/Output Summary (Last 24 hours) at 08/08/14 0902 Last data filed at 08/08/14 0626  Gross per 24 hour  Intake      0 ml  Output   1225 ml  Net  -1225 ml   Physical Exam:  Appearance: in NAD, standing by window at beginning of exam HEENT: well-healed scar on left cheek, otherwise AT/[place], PERRL, EOMi  Heart: RRR, normal S1S2  Lungs: CTAB, diffuse rhonchi, normal effort  Abdomen: BS+, soft, nontender, no hepatosplenomegaly  Musculoskeletal: hardware evident under skin on distal LLE, slight tenderness surrounding it, no edema, normal range of motion  Neurologic: CN II-XII intact, sensation and strength 5/5 throughout, normal coordination  Skin: no rashes, no lesions other than port scars on LLE  Lab Results: Basic Metabolic Panel:  Recent Labs Lab 08/07/14 0941 08/08/14 0421  NA 142 143  K 4.2 4.3  CL 104 104  CO2 26 26  GLUCOSE 89 87  BUN 18 20  CREATININE 1.37* 1.36*  CALCIUM 9.4 9.1   Liver Function Tests:  Recent Labs Lab 08/07/14 0941  AST 24  ALT 25  ALKPHOS 77  BILITOT 0.5  PROT 7.7  ALBUMIN 3.8   CBC:  Recent Labs Lab 08/07/14 0941  WBC 4.6  HGB 15.0  HCT 43.0  MCV 94.1  PLT 156   Cardiac Enzymes:  Recent Labs Lab 08/07/14 0927  TROPONINI <0.30   BNP:  Recent Labs Lab 08/07/14 0927   PROBNP 1014.0*   Urine Drug Screen: Drugs of Abuse     Component Value Date/Time   LABOPIA NONE DETECTED 08/07/2014 1128   COCAINSCRNUR POSITIVE* 08/07/2014 1128   LABBENZ NONE DETECTED 08/07/2014 1128   AMPHETMU NONE DETECTED 08/07/2014 1128   THCU NONE DETECTED 08/07/2014 1128   LABBARB NONE DETECTED 08/07/2014 1128    Studies/Results: Dg Chest 2 View  08/07/2014   CLINICAL DATA:  Hypertension  EXAM: CHEST  2 VIEW  COMPARISON:  10/11/2012  FINDINGS: Cardiomediastinal silhouette is stable. No acute infiltrate or pleural effusion. No pulmonary edema. Bony thorax is unremarkable.  IMPRESSION: No active cardiopulmonary disease.   Electronically Signed   By: Lahoma Crocker M.D.   On: 08/07/2014 12:21   Medications: I have reviewed the patient's current medications. Scheduled Meds: . amLODipine  10 mg Oral Daily  . folic acid  1 mg Oral Daily  . heparin  5,000 Units Subcutaneous 3 times per day  . multivitamin with minerals  1 tablet Oral Daily  . sodium chloride  3 mL Intravenous Q12H  . thiamine  100 mg Oral Daily   Or  . thiamine  100 mg Intravenous Daily   Continuous Infusions:  PRN Meds:. Assessment/Plan: Principal Problem:   Hypertensive  emergency Active Problems:   Polysubstance abuse   AKI (acute kidney injury)  Edwin Rodriguez is a 43 yo man with a past medical history of poorly controlled hypertension who was sent to the ED for evaluation of a BP to 240/160 that was noted in the outpatient setting. This hypertension was corrected with hydralazine, but the patient had an elevated creatinine; he was therefore admitted for observation. By history, it is clear that the patient has not been taking his medications for at least 3 weeks. His symptoms of dizziness, blurred vision and headache are likely the result of very high blood pressures as an outpatient over the past week and have resolved. Because his orthopaedic surgeon, Dr. Marcelino Scot, is concerned that he may continue to have high BPs in  the future as an outpatient (in the setting of a rescheduled surgery in the future), he is requesting that we keep him in house for the rescheduled procedure tomorrow morning.   Hypertensive Emergency: Pressures as high as 199/146 in the hospital and 230/140 by report in outpatient setting today with associated symptoms, a bump in creatinine to 1.37 (from 0.87-1.00 at baseline) and BNP 1014. EKG unchanged from 09/2012 EKG. BP has fallen to 160s/100s and symptoms have all resolved. Initially considered broader workup if hypertension persisted on medication, but he seems to be responding to low dose amlodipine with hydralazine PRN (given once overnight). Unfortunately, the patient is a poor candidate for BB (cocaine abuse), ACE/ARB/diuretics at this time (renal function), and clonidine/hydralazine (long history of noncompliance as outpatient). - Started amlodipine 5 mg yesterday; increased to 10 mg today - Tele  - Echo today - Recommend adding ACE as outpatient as creatinine normalizes  AKI: Creatinine elevated to 1.37-->1.36, which is above baseline. Urine sodium 117, urine creatinine 115 and FeNA 1%. UA negative. History of AKI on 09/2012 admission.  - Recheck in am - Recheck creatinine as outpatient; recommend initiating ACE if normalized  LLE ORIF Internal Hardware: Called to reschedule hardware removal with orthopaedics (Dr. Marcelino Scot) - On schedule for ortho surgery in am tomorrow  Polysubstance Abuse: Cocaine reportedly last used 2 days PTA (clarified today); +cocaine on UDS - Implemented CIWA checks (without ativan); score of 4-5 - Counseling on dangers of cocaine abuse and alcohol abuse has been initiated; recommend outpatient counseling / rehab  - Avoid beta blockers   DVT Ppx:  - [place] heparin   Diet:  - Heart healthy  Dispo: Disposition is deferred at this time, awaiting improvement of current medical problems.  Anticipated discharge in approximately 1 day(s).   The patient does not  have a current PCP (No Pcp Per Patient) and does not need an [facility] hospital follow-up appointment after discharge.  The patient does not have transportation limitations that hinder transportation to clinic appointments.  .Services Needed at time of discharge: Y = Yes, Blank = No PT:   OT:   RN:   Equipment:   Other:     LOS: 1 day   Drucilla Schmidt, MD 08/08/2014, 9:02 AM

## 2014-08-08 NOTE — Progress Notes (Signed)
Pt arrive to unit in no s/s of distress. Pt A&Ox4. Pt oriented to unit and room. Whiteboard updated. VS stable - BP elevated as prior to arrival to unit - scheduled IV hydralazine admin. Will continue to monitor and keep on call physician informed. Report received from ED nurse prior to pt's arrival. Tele applied. Callbell within reach. Will continue to monitor.

## 2014-08-09 ENCOUNTER — Inpatient Hospital Stay (HOSPITAL_COMMUNITY): Payer: Medicaid Other | Admitting: Certified Registered Nurse Anesthetist

## 2014-08-09 ENCOUNTER — Encounter (HOSPITAL_COMMUNITY): Payer: Self-pay | Admitting: Critical Care Medicine

## 2014-08-09 ENCOUNTER — Encounter (HOSPITAL_COMMUNITY): Payer: Medicaid Other | Admitting: Certified Registered Nurse Anesthetist

## 2014-08-09 ENCOUNTER — Inpatient Hospital Stay (HOSPITAL_COMMUNITY): Payer: Medicaid Other

## 2014-08-09 ENCOUNTER — Encounter (HOSPITAL_COMMUNITY)
Admission: RE | Disposition: A | Discharge status: Home / Self Care | Payer: Self-pay | Source: Ambulatory Visit | Attending: Internal Medicine

## 2014-08-09 DIAGNOSIS — I129 Hypertensive chronic kidney disease with stage 1 through stage 4 chronic kidney disease, or unspecified chronic kidney disease: Secondary | ICD-10-CM

## 2014-08-09 DIAGNOSIS — N189 Chronic kidney disease, unspecified: Secondary | ICD-10-CM

## 2014-08-09 HISTORY — PX: HARDWARE REMOVAL: SHX979

## 2014-08-09 LAB — BASIC METABOLIC PANEL
ANION GAP: 10 (ref 5–15)
BUN: 19 mg/dL (ref 6–23)
CHLORIDE: 102 meq/L (ref 96–112)
CO2: 28 mEq/L (ref 19–32)
Calcium: 9.1 mg/dL (ref 8.4–10.5)
Creatinine, Ser: 1.36 mg/dL — ABNORMAL HIGH (ref 0.50–1.35)
GFR calc Af Amer: 73 mL/min — ABNORMAL LOW (ref >90)
GFR calc non Af Amer: 63 mL/min — ABNORMAL LOW (ref >90)
Glucose, Bld: 81 mg/dL (ref 70–99)
Potassium: 4.3 mEq/L (ref 3.7–5.3)
Sodium: 140 mEq/L (ref 137–147)

## 2014-08-09 LAB — SURGICAL PCR SCREEN
MRSA, PCR: NEGATIVE
Staphylococcus aureus: NEGATIVE

## 2014-08-09 SURGERY — REMOVAL, HARDWARE
Anesthesia: General | Laterality: Left

## 2014-08-09 MED ORDER — PROPOFOL 10 MG/ML IV BOLUS
INTRAVENOUS | Status: DC | PRN
  Administered 2014-08-09: 200 mg via INTRAVENOUS

## 2014-08-09 MED ORDER — MIDAZOLAM HCL 5 MG/5ML IJ SOLN
INTRAMUSCULAR | Status: DC | PRN
  Administered 2014-08-09: 2 mg via INTRAVENOUS

## 2014-08-09 MED ORDER — METHOCARBAMOL 500 MG PO TABS
1000.0000 mg | ORAL_TABLET | Freq: Four times a day (QID) | ORAL | Status: DC | PRN
  Filled 2014-08-09: qty 2

## 2014-08-09 MED ORDER — PROPOFOL 10 MG/ML IV BOLUS
INTRAVENOUS | Status: AC
  Filled 2014-08-09: qty 20

## 2014-08-09 MED ORDER — HYDROMORPHONE HCL 1 MG/ML IJ SOLN
0.2500 mg | INTRAMUSCULAR | Status: DC | PRN
  Administered 2014-08-09 (×4): 0.5 mg via INTRAVENOUS

## 2014-08-09 MED ORDER — DEXAMETHASONE SODIUM PHOSPHATE 4 MG/ML IJ SOLN
INTRAMUSCULAR | Status: DC | PRN
  Administered 2014-08-09: 4 mg via INTRAVENOUS

## 2014-08-09 MED ORDER — MIDAZOLAM HCL 2 MG/2ML IJ SOLN
INTRAMUSCULAR | Status: AC
  Filled 2014-08-09: qty 2

## 2014-08-09 MED ORDER — OXYCODONE HCL 5 MG PO TABS
5.0000 mg | ORAL_TABLET | Freq: Once | ORAL | Status: AC | PRN
  Administered 2014-08-09: 5 mg via ORAL

## 2014-08-09 MED ORDER — DEXAMETHASONE SODIUM PHOSPHATE 4 MG/ML IJ SOLN
INTRAMUSCULAR | Status: AC
  Filled 2014-08-09: qty 1

## 2014-08-09 MED ORDER — OXYCODONE-ACETAMINOPHEN 5-325 MG PO TABS
1.0000 | ORAL_TABLET | Freq: Four times a day (QID) | ORAL | Status: DC | PRN
  Administered 2014-08-10: 2 via ORAL
  Filled 2014-08-09: qty 2

## 2014-08-09 MED ORDER — HYDRALAZINE HCL 20 MG/ML IJ SOLN
INTRAMUSCULAR | Status: DC | PRN
  Administered 2014-08-09: 5 mg via INTRAVENOUS

## 2014-08-09 MED ORDER — METHYLPREDNISOLONE ACETATE 40 MG/ML IJ SUSP
INTRAMUSCULAR | Status: AC
  Filled 2014-08-09: qty 1

## 2014-08-09 MED ORDER — OXYCODONE HCL 5 MG PO TABS
5.0000 mg | ORAL_TABLET | ORAL | Status: DC | PRN
  Administered 2014-08-09 – 2014-08-10 (×2): 10 mg via ORAL
  Filled 2014-08-09 (×3): qty 2

## 2014-08-09 MED ORDER — OXYCODONE HCL 5 MG PO TABS
ORAL_TABLET | ORAL | Status: AC
  Filled 2014-08-09: qty 1

## 2014-08-09 MED ORDER — HYDROMORPHONE HCL 1 MG/ML IJ SOLN
INTRAMUSCULAR | Status: AC
  Administered 2014-08-09: 0.5 mg via INTRAVENOUS
  Filled 2014-08-09: qty 2

## 2014-08-09 MED ORDER — FENTANYL CITRATE 0.05 MG/ML IJ SOLN
INTRAMUSCULAR | Status: DC | PRN
  Administered 2014-08-09 (×3): 25 ug via INTRAVENOUS
  Administered 2014-08-09: 150 ug via INTRAVENOUS
  Administered 2014-08-09: 25 ug via INTRAVENOUS
  Administered 2014-08-09: 50 ug via INTRAVENOUS
  Administered 2014-08-09: 25 ug via INTRAVENOUS
  Administered 2014-08-09: 50 ug via INTRAVENOUS
  Administered 2014-08-09: 25 ug via INTRAVENOUS

## 2014-08-09 MED ORDER — OXYCODONE HCL 5 MG PO TABS: 5.0000 mg | ORAL_TABLET | ORAL | Status: DC | PRN

## 2014-08-09 MED ORDER — FENTANYL CITRATE 0.05 MG/ML IJ SOLN
INTRAMUSCULAR | Status: AC
  Filled 2014-08-09: qty 5

## 2014-08-09 MED ORDER — 0.9 % SODIUM CHLORIDE (POUR BTL) OPTIME
TOPICAL | Status: DC | PRN
  Administered 2014-08-09: 1000 mL

## 2014-08-09 MED ORDER — PHENYLEPHRINE HCL 10 MG/ML IJ SOLN
10.0000 mg | INTRAVENOUS | Status: DC | PRN
  Administered 2014-08-09: 10 ug/min via INTRAVENOUS

## 2014-08-09 MED ORDER — PHENYLEPHRINE HCL 10 MG/ML IJ SOLN
INTRAMUSCULAR | Status: DC | PRN
  Administered 2014-08-09: 40 ug via INTRAVENOUS
  Administered 2014-08-09: 80 ug via INTRAVENOUS
  Administered 2014-08-09: 120 ug via INTRAVENOUS
  Administered 2014-08-09: 40 ug via INTRAVENOUS
  Administered 2014-08-09: 80 ug via INTRAVENOUS

## 2014-08-09 MED ORDER — ONDANSETRON HCL 4 MG/2ML IJ SOLN
INTRAMUSCULAR | Status: AC
  Filled 2014-08-09: qty 2

## 2014-08-09 MED ORDER — LIDOCAINE HCL (CARDIAC) 10 MG/ML IV SOLN
INTRAVENOUS | Status: DC | PRN
  Administered 2014-08-09: 100 mg via INTRAVENOUS

## 2014-08-09 MED ORDER — HYDROMORPHONE HCL 1 MG/ML IJ SOLN
INTRAMUSCULAR | Status: AC
  Administered 2014-08-09: 0.5 mg via INTRAVENOUS
  Filled 2014-08-09: qty 1

## 2014-08-09 MED ORDER — PHENYLEPHRINE HCL 10 MG/ML IJ SOLN
INTRAMUSCULAR | Status: AC
  Filled 2014-08-09: qty 1

## 2014-08-09 MED ORDER — LACTATED RINGERS IV SOLN
INTRAVENOUS | Status: DC
  Administered 2014-08-09 (×2): via INTRAVENOUS

## 2014-08-09 MED ORDER — HYDRALAZINE HCL 20 MG/ML IJ SOLN
5.0000 mg | Freq: Once | INTRAMUSCULAR | Status: AC
  Administered 2014-08-09: 5 mg via INTRAVENOUS

## 2014-08-09 MED ORDER — AMLODIPINE BESYLATE 10 MG PO TABS: 10.0000 mg | ORAL_TABLET | Freq: Every day | ORAL | Status: DC

## 2014-08-09 MED ORDER — LISINOPRIL 5 MG PO TABS: 5.0000 mg | ORAL_TABLET | Freq: Every day | ORAL | Status: DC

## 2014-08-09 MED ORDER — HYDROMORPHONE HCL 1 MG/ML IJ SOLN
0.5000 mg | INTRAMUSCULAR | Status: AC | PRN
  Administered 2014-08-09 (×4): 0.5 mg via INTRAVENOUS

## 2014-08-09 MED ORDER — PHENYLEPHRINE 40 MCG/ML (10ML) SYRINGE FOR IV PUSH (FOR BLOOD PRESSURE SUPPORT)
PREFILLED_SYRINGE | INTRAVENOUS | Status: AC
  Filled 2014-08-09: qty 10

## 2014-08-09 MED ORDER — HYDRALAZINE HCL 20 MG/ML IJ SOLN
INTRAMUSCULAR | Status: AC
  Filled 2014-08-09: qty 1

## 2014-08-09 MED ORDER — ROCURONIUM BROMIDE 50 MG/5ML IV SOLN
INTRAVENOUS | Status: AC
  Filled 2014-08-09: qty 1

## 2014-08-09 MED ORDER — PROMETHAZINE HCL 25 MG/ML IJ SOLN: 6.2500 mg | INTRAMUSCULAR | Status: DC | PRN

## 2014-08-09 MED ORDER — ONDANSETRON HCL 4 MG/2ML IJ SOLN
INTRAMUSCULAR | Status: DC | PRN
  Administered 2014-08-09: 4 mg via INTRAVENOUS

## 2014-08-09 MED ORDER — BUPIVACAINE-EPINEPHRINE (PF) 0.5% -1:200000 IJ SOLN
INTRAMUSCULAR | Status: AC
  Filled 2014-08-09: qty 30

## 2014-08-09 MED ORDER — HYDROMORPHONE HCL 1 MG/ML IJ SOLN
1.0000 mg | INTRAMUSCULAR | Status: DC | PRN
  Administered 2014-08-09 – 2014-08-10 (×3): 2 mg via INTRAVENOUS
  Filled 2014-08-09 (×3): qty 2

## 2014-08-09 MED ORDER — OXYCODONE HCL 5 MG/5ML PO SOLN: 5.0000 mg | Freq: Once | ORAL | Status: AC | PRN

## 2014-08-09 MED ORDER — LIDOCAINE HCL (CARDIAC) 20 MG/ML IV SOLN
INTRAVENOUS | Status: AC
  Filled 2014-08-09: qty 5

## 2014-08-09 SURGICAL SUPPLY — 59 items
BANDAGE ELASTIC 4 VELCRO ST LF (GAUZE/BANDAGES/DRESSINGS) ×3 IMPLANT
BANDAGE ELASTIC 6 VELCRO ST LF (GAUZE/BANDAGES/DRESSINGS) IMPLANT
BANDAGE ESMARK 6X9 LF (GAUZE/BANDAGES/DRESSINGS) ×1 IMPLANT
BNDG COHESIVE 6X5 TAN STRL LF (GAUZE/BANDAGES/DRESSINGS) ×3 IMPLANT
BNDG ESMARK 6X9 LF (GAUZE/BANDAGES/DRESSINGS) ×3
BNDG GAUZE ELAST 4 BULKY (GAUZE/BANDAGES/DRESSINGS) ×3 IMPLANT
BRUSH SCRUB DISP (MISCELLANEOUS) ×6 IMPLANT
CLEANER TIP ELECTROSURG 2X2 (MISCELLANEOUS) IMPLANT
CLOSURE WOUND 1/2 X4 (GAUZE/BANDAGES/DRESSINGS)
COVER SURGICAL LIGHT HANDLE (MISCELLANEOUS) ×6 IMPLANT
CUFF TOURNIQUET SINGLE 18IN (TOURNIQUET CUFF) IMPLANT
CUFF TOURNIQUET SINGLE 24IN (TOURNIQUET CUFF) ×3 IMPLANT
CUFF TOURNIQUET SINGLE 34IN LL (TOURNIQUET CUFF) IMPLANT
DRAPE C-ARM 42X72 X-RAY (DRAPES) ×3 IMPLANT
DRAPE C-ARMOR (DRAPES) IMPLANT
DRAPE OEC MINIVIEW 54X84 (DRAPES) ×3 IMPLANT
DRAPE U-SHAPE 47X51 STRL (DRAPES) ×3 IMPLANT
DRSG ADAPTIC 3X8 NADH LF (GAUZE/BANDAGES/DRESSINGS) ×3 IMPLANT
DRSG PAD ABDOMINAL 8X10 ST (GAUZE/BANDAGES/DRESSINGS) ×3 IMPLANT
ELECT REM PT RETURN 9FT ADLT (ELECTROSURGICAL) ×3
ELECTRODE REM PT RTRN 9FT ADLT (ELECTROSURGICAL) ×1 IMPLANT
EVACUATOR 1/8 PVC DRAIN (DRAIN) IMPLANT
GAUZE SPONGE 4X4 12PLY STRL (GAUZE/BANDAGES/DRESSINGS) ×3 IMPLANT
GLOVE BIO SURGEON STRL SZ7.5 (GLOVE) ×3 IMPLANT
GLOVE BIO SURGEON STRL SZ8 (GLOVE) ×3 IMPLANT
GLOVE BIOGEL PI IND STRL 7.5 (GLOVE) ×1 IMPLANT
GLOVE BIOGEL PI IND STRL 8 (GLOVE) ×1 IMPLANT
GLOVE BIOGEL PI INDICATOR 7.5 (GLOVE) ×2
GLOVE BIOGEL PI INDICATOR 8 (GLOVE) ×2
GOWN STRL REUS W/ TWL LRG LVL3 (GOWN DISPOSABLE) ×2 IMPLANT
GOWN STRL REUS W/ TWL XL LVL3 (GOWN DISPOSABLE) IMPLANT
GOWN STRL REUS W/TWL LRG LVL3 (GOWN DISPOSABLE) ×4
GOWN STRL REUS W/TWL XL LVL3 (GOWN DISPOSABLE)
KIT BASIN OR (CUSTOM PROCEDURE TRAY) ×3 IMPLANT
KIT ROOM TURNOVER OR (KITS) ×3 IMPLANT
MANIFOLD NEPTUNE II (INSTRUMENTS) ×3 IMPLANT
NEEDLE 22X1 1/2 (OR ONLY) (NEEDLE) ×3 IMPLANT
NS IRRIG 1000ML POUR BTL (IV SOLUTION) ×3 IMPLANT
PACK ORTHO EXTREMITY (CUSTOM PROCEDURE TRAY) ×3 IMPLANT
PAD ARMBOARD 7.5X6 YLW CONV (MISCELLANEOUS) ×6 IMPLANT
SPONGE LAP 18X18 X RAY DECT (DISPOSABLE) ×3 IMPLANT
STAPLER VISISTAT 35W (STAPLE) ×3 IMPLANT
STOCKINETTE IMPERVIOUS LG (DRAPES) ×3 IMPLANT
STRIP CLOSURE SKIN 1/2X4 (GAUZE/BANDAGES/DRESSINGS) IMPLANT
SUCTION FRAZIER TIP 10 FR DISP (SUCTIONS) ×3 IMPLANT
SUT ETHILON 3 0 PS 1 (SUTURE) ×6 IMPLANT
SUT PDS AB 2-0 CT1 27 (SUTURE) IMPLANT
SUT VIC AB 0 CT1 27 (SUTURE)
SUT VIC AB 0 CT1 27XBRD ANBCTR (SUTURE) IMPLANT
SUT VIC AB 2-0 CT1 27 (SUTURE)
SUT VIC AB 2-0 CT1 TAPERPNT 27 (SUTURE) IMPLANT
SYR CONTROL 10ML LL (SYRINGE) ×3 IMPLANT
TOWEL OR 17X24 6PK STRL BLUE (TOWEL DISPOSABLE) ×6 IMPLANT
TOWEL OR 17X26 10 PK STRL BLUE (TOWEL DISPOSABLE) ×3 IMPLANT
TUBE CONNECTING 12'X1/4 (SUCTIONS) ×1
TUBE CONNECTING 12X1/4 (SUCTIONS) ×2 IMPLANT
UNDERPAD 30X30 INCONTINENT (UNDERPADS AND DIAPERS) ×3 IMPLANT
WATER STERILE IRR 1000ML POUR (IV SOLUTION) ×6 IMPLANT
YANKAUER SUCT BULB TIP NO VENT (SUCTIONS) ×3 IMPLANT

## 2014-08-09 NOTE — Clinical Documentation Improvement (Signed)
Possible Clinical Conditions?    Accelerated Hypertension Malignant Hypertension Other Condition Cannot Clinically Determine     Risk Factors: Hypertensive emergency with pressures as high as 199/146 in the hospital and 230/140 by report in outpatient setting today with associated symptoms per 9/15 progress notes. Uncontrolled hypertension per 9/15 progress notes.    Thank You, Theron Arista, Clinical Documentation Specialist:  [phone]  [place] Information Management

## 2014-08-09 NOTE — Brief Op Note (Signed)
08/07/2014 - 08/09/2014  5:20 PM  PATIENT:  Edwin Rodriguez  43 y.o. male  PRE-OPERATIVE DIAGNOSIS:  symptomatic hardware left ankle, ankle arthritis  POST-OPERATIVE DIAGNOSIS:  symptomatic hardware left ankle, ankle arthritis, extensive tethering EHL tendon  PROCEDURE:  Procedure(s): 1. HARDWARE REMOVAL LEFT ANKLE (Left) 2. Arthrotomy of left ankle with debridement 3. Tenolysis of EHL tendon  SURGEON:  Surgeon(s) and Role:    * Rozanna Box, MD - Primary  PHYSICIAN ASSISTANT: Ainsley Spinner, PA-C  ANESTHESIA:   general  I/O:  Total I/O In: 1300 [I.V.:1300] Out: 150 [Blood:150]  SPECIMEN:  No Specimen  TOURNIQUET:   Total Tourniquet Time Documented: Thigh (Left) - 49 minutes Total: Thigh (Left) - 49 minutes   DICTATION: .Other Dictation: Dictation Number [id number]

## 2014-08-09 NOTE — Anesthesia Postprocedure Evaluation (Signed)
Anesthesia Post Note  Patient: Edwin Rodriguez  Procedure(s) Performed: Procedure(s) (LRB): HARDWARE REMOVAL LEFT ANKLE (Left)  Anesthesia type: general  Patient location: PACU  Post pain: Pain level controlled  Post assessment: Patient's Cardiovascular Status Stable  Last Vitals:  Filed Vitals:   08/09/14 1556  BP: 104/81  Pulse: 97  Temp: 36.9 C  Resp: 16    Post vital signs: Reviewed and stable  Level of consciousness: sedated  Complications: No apparent anesthesia complications

## 2014-08-09 NOTE — Progress Notes (Signed)
Subjective: Edwin Rodriguez feels well. He is eager for his surgery; has some questions about when he can weight bear on his left leg after the procedure. He denies headache, blurred vision or dizziness.  He did not require hydralazine overnight (SBP stayed under 180). CIWA score 0.  Patient would like new PMD in [place] and needs assistance with ride home today.  Orthopaedic surgery (removal of LLE hardware) scheduled for 10:30 AM.  Objective: Vital signs in last 24 hours: Filed Vitals:   08/08/14 2226 08/09/14 0028 08/09/14 0623 08/09/14 0628  BP: 175/110 157/89 158/104 161/99  Pulse:  84 67   Temp:  98.8 F (37.1 C) 98.4 F (36.9 C)   TempSrc:  Oral Oral   Resp:  18 18   Height:      Weight:      SpO2:  98% 96%    Weight change:   Intake/Output Summary (Last 24 hours) at 08/09/14 0647 Last data filed at 08/08/14 1824  Gross per 24 hour  Intake   1664 ml  Output    700 ml  Net    964 ml   Physical Exam:  Appearance: in NAD, watching TV HEENT: well-healed scar on left cheek, otherwise AT/[place], PERRL, EOMi  Heart: RRR, normal S1S2  Lungs: CTAB, normal effort  Abdomen: BS+, soft, nontender, no hepatosplenomegaly  Musculoskeletal: hardware evident under skin on distal LLE, slight tenderness surrounding it, no edema, normal range of motion  Neurologic: CN II-XII intact, sensation and strength 5/5 throughout, normal coordination  Skin: no rashes, no lesions other than port scars on LLE  Lab Results: Basic Metabolic Panel:  Recent Labs Lab 08/07/14 0941 08/08/14 0421  NA 142 143  K 4.2 4.3  CL 104 104  CO2 26 26  GLUCOSE 89 87  BUN 18 20  CREATININE 1.37* 1.36*  CALCIUM 9.4 9.1   Liver Function Tests:  Recent Labs Lab 08/07/14 0941  AST 24  ALT 25  ALKPHOS 77  BILITOT 0.5  PROT 7.7  ALBUMIN 3.8   CBC:  Recent Labs Lab 08/07/14 0941  WBC 4.6  HGB 15.0  HCT 43.0  MCV 94.1  PLT 156   Cardiac Enzymes:  Recent Labs Lab 08/07/14 0927  08/08/14 1155  TROPONINI <0.30 <0.30   BNP:  Recent Labs Lab 08/07/14 0927  PROBNP 1014.0*   Urine Drug Screen: Drugs of Abuse     Component Value Date/Time   LABOPIA NONE DETECTED 08/07/2014 1128   COCAINSCRNUR POSITIVE* 08/07/2014 1128   LABBENZ NONE DETECTED 08/07/2014 1128   AMPHETMU NONE DETECTED 08/07/2014 1128   THCU NONE DETECTED 08/07/2014 1128   LABBARB NONE DETECTED 08/07/2014 1128    Studies/Results: Dg Chest 2 View  08/07/2014   CLINICAL DATA:  Hypertension  EXAM: CHEST  2 VIEW  COMPARISON:  10/11/2012  FINDINGS: Cardiomediastinal silhouette is stable. No acute infiltrate or pleural effusion. No pulmonary edema. Bony thorax is unremarkable.  IMPRESSION: No active cardiopulmonary disease.   Electronically Signed   By: Lahoma Crocker M.D.   On: 08/07/2014 12:21   Medications: I have reviewed the patient's current medications. Scheduled Meds: . amLODipine  10 mg Oral Daily  .  ceFAZolin (ANCEF) IV  2 g Intravenous Once  . folic acid  1 mg Oral Daily  . heparin  5,000 Units Subcutaneous 3 times per day  . multivitamin with minerals  1 tablet Oral Daily  . sodium chloride  3 mL Intravenous Q12H  . thiamine  100 mg  Oral Daily   Continuous Infusions:  PRN Meds:.acetaminophen, hydrALAZINE Assessment/Plan: Principal Problem:   Hypertensive emergency Active Problems:   Polysubstance abuse   AKI (acute kidney injury)  Edwin Rodriguez is a 43 yo man with a past medical history of poorly controlled hypertension who was sent to the ED for evaluation of a BP to 240/160 that was noted in the outpatient setting; he was in pre-op for an elective orthopaedic surgery. This hypertension was corrected with hydralazine, but the patient had an elevated creatinine; he was therefore admitted for observation. By history, it is clear that the patient has not been taking his medications for at least 3 weeks. His symptoms of dizziness, blurred vision and headache are likely the result of very high blood  pressures as an outpatient over the past week and have resolved. His systolic blood pressures have remained under 180. This morning, he will complete his orthopaedic removal of hardware surgery, then will be discharged.  Hypertensive Emergency: Pressures as high as 199/146 in the hospital and 230/140 by report in outpatient setting today with associated symptoms, a bump in creatinine to 1.37 (from 0.87-1.00 at baseline) and BNP 1014. EKG unchanged from 09/2012 EKG. BP has fallen to 160s/100s and symptoms have all resolved. Initially considered broader workup if hypertension persisted on medication, but he seems to be responding to low dose amlodipine. Unfortunately, the patient is a poor candidate for BB (cocaine abuse), ACE/ARB/diuretics at this time (renal function), and clonidine/hydralazine (long history of noncompliance as outpatient). Echo showed EF 65-70% with moderate LV hypertrophy (EF is actually improved from 2013 echo). - Given one dose 5 mg hydralazine this morning (prior to surgery) - Continue amlodipine 10 mg; will add low-dose ACE prescription for home medication  CKD: Creatinine elevated to 1.37-->1.36-->1.36; appears more likely to be his new creatinine level (CKD) rather than AKI. Urine sodium 117, urine creatinine 115 and FeNA 1%. UA negative.   LLE ORIF Internal Hardware: On schedule at 10:30AM with Dr. Marcelino Scot today.  Polysubstance Abuse: Cocaine reportedly last used 2 days PTA; +cocaine on UDS - CIWA checks (without ativan); score 5-->0 - Counseling on dangers of cocaine abuse and alcohol abuse has been initiated; recommend outpatient counseling / rehab   DVT Ppx:  - [place] heparin   Diet:  - Heart healthy  Dispo: Disposition is deferred at this time, awaiting improvement of current medical problems.  Anticipated discharge in approximately 0 day(s).   The patient does not have a current PCP (No Pcp Per Patient) and does not need an [facility] hospital follow-up appointment after  discharge.  The patient does not have transportation limitations that hinder transportation to clinic appointments.  .Services Needed at time of discharge: Y = Yes, Blank = No PT:   OT:   RN:   Equipment:   Other:     LOS: 2 days   Drucilla Schmidt, MD 08/09/2014, 6:47 AM

## 2014-08-09 NOTE — Anesthesia Procedure Notes (Signed)
Procedure Name: LMA Insertion Date/Time: 08/09/2014 11:58 AM Performed by: Carola Frost Pre-anesthesia Checklist: Patient identified, Timeout performed, Emergency Drugs available, Suction available and Patient being monitored Patient Re-evaluated:Patient Re-evaluated prior to inductionOxygen Delivery Method: Circle system utilized Preoxygenation: Pre-oxygenation with 100% oxygen Intubation Type: IV induction Ventilation: Mask ventilation without difficulty LMA: LMA inserted LMA Size: 5.0 Placement Confirmation: CO2 detector,  positive ETCO2 and breath sounds checked- equal and bilateral Tube secured with: Tape Dental Injury: Teeth and Oropharynx as per pre-operative assessment

## 2014-08-09 NOTE — Clinical Documentation Improvement (Signed)
Possible Clinical Conditions?    _______CKD Stage I - GFR > OR = 90 _______CKD Stage II - GFR 60-80 _______CKD Stage III - GFR 30-59 _______CKD Stage IV - GFR 15-29 _______CKD Stage V - GFR < 15 _______ESRD (End Stage Renal Disease) _______Other condition _______Cannot Clinically determine     Risk Factors:  CKD per 9/15 progress notes.   Diagnostics:  Bun: 9/17: 19 9/15: 18  Creat: 9/17: 1.36 9/15: 1.37  GFR: 9/17: 73 9/15: 72   Thank You, Theron Arista, Clinical Documentation Specialist:  [place] Information Management
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
## 2014-08-09 NOTE — Progress Notes (Signed)
I have reviewed and discussed in detail with Edwin Rodriguez the patient's presentation, examination findings, and I formulated the plan outlined above.  [name], MD [facility] [phone] (p)   

## 2014-08-09 NOTE — Progress Notes (Signed)
Orthopedic Tech Progress Note Patient Details:  Edwin Rodriguez [birth year] [id number]  Ortho Devices Type of Ortho Device: Crutches (prafo) Ortho Device/Splint Location: lle Ortho Device/Splint Interventions: Application   Jovane Foutz 08/09/2014, 6:43 PM

## 2014-08-09 NOTE — Consult Note (Signed)
Edwin Rodriguez is an 43 y.o. male.   Chief Complaint:  Symptomatic left ankle HPI: Pilon fracture with associated compartment syndrome in 2013, repaired definitively in February of 2014, now with recent hardware migration and tenderness; films show 52m of prominence; cancelled surgery for severe hypertension on Tuesday and gained better control with medicine service management.  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  Patient states that he can stop on his own and does not need help.  Past Medical History  Diagnosis Date  . DVT (deep venous thrombosis)   . PE (pulmonary embolism)   . Osteomyelitis of left leg   . Hypertension     "does not have primary doctor at this time"  . Chronic kidney disease     "borderline"  . Fracture of leg     left lower  . History of blood transfusion     "related to blood loss when I had surgery"  . Arthritis     "knees" (08/07/2014)    Past Surgical History  Procedure Laterality Date  . Patella fracture surgery Bilateral 1983  . Hip surgery Bilateral 1983    "used bones from my hips for my kneecaps"  . Knee hardware removal      due to infection   . Orif tibia fracture  10/11/2012    Procedure: OPEN REDUCTION INTERNAL FIXATION (ORIF) TIBIA FRACTURE;  Surgeon: MRozanna Box MD;  [place] [facility]  Service: Orthopedics;  Laterality: Left;  . I&d extremity  10/14/2012    Procedure: IRRIGATION AND DEBRIDEMENT EXTREMITY;  Surgeon: MRozanna Box MD;  [place] [facility]  Service: Orthopedics;  Laterality: Left;  with vac change   . Skin split graft  10/17/2012    Procedure: SKIN GRAFT SPLIT THICKNESS;  Surgeon: MRozanna Box MD;  [place] [facility]  Service: Orthopedics;  Laterality: Left;  Split thickness skin graft left lower leg from Right Thigh  . Application of wound vac  10/17/2012    Procedure: APPLICATION OF WOUND  VAC;  Surgeon: MRozanna Box MD;  [place] [facility]  Service: Orthopedics;  Laterality: Left;  re-application of wound vac medial aspect of left lower leg  . External fixation removal Left 01/19/2013    Procedure: REMOVAL EXTERNAL FIXATION LEG;  Surgeon: MRozanna Box MD;  [place] [facility]  Service: Orthopedics;  Laterality: Left;  . 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    Family History  Problem Relation Age of Onset  . Hypertension Other    Social History:  reports that he has been smoking Cigarettes.  He has a 6 pack-year smoking history. He has never used smokeless tobacco. He reports that he drinks about .6 ounces of alcohol per week. He reports that he uses illicit drugs (Marijuana and Cocaine).  Allergies: No Known Allergies  Medications Prior to Admission  Medication Sig Dispense Refill  . losartan-hydrochlorothiazide (HYZAAR) 50-12.5 MG per tablet Take 1 tablet by mouth daily.      . naproxen (NAPROSYN) 500 MG tablet Take 500 mg by mouth 2 (two) times daily with a meal.        Results for orders placed during  the hospital encounter of 08/07/14 (from the past 48 hour(s))  URINE RAPID DRUG SCREEN (HOSP PERFORMED)     Status: Abnormal   Collection Time    08/07/14 11:28 AM      Result Value Ref Range   Opiates NONE DETECTED  NONE DETECTED   Cocaine POSITIVE (*) NONE DETECTED   Benzodiazepines NONE DETECTED  NONE DETECTED   Amphetamines NONE DETECTED  NONE DETECTED   Tetrahydrocannabinol NONE DETECTED  NONE DETECTED   Barbiturates NONE DETECTED  NONE DETECTED   Comment:            DRUG SCREEN FOR MEDICAL PURPOSES     ONLY.  IF CONFIRMATION IS NEEDED     FOR ANY PURPOSE, NOTIFY LAB     WITHIN 5 DAYS.                LOWEST DETECTABLE LIMITS      FOR URINE DRUG SCREEN     Drug Class       Cutoff (ng/mL)     Amphetamine      1000     Barbiturate      200     Benzodiazepine   409     Tricyclics       811     Opiates          300     Cocaine          300     THC              50  SODIUM, URINE, RANDOM     Status: None   Collection Time    08/08/14 12:00 AM      Result Value Ref Range   Sodium, Ur 117    CREATININE, URINE, RANDOM     Status: None   Collection Time    08/08/14 12:00 AM      Result Value Ref Range   Creatinine, Urine 914.78    BASIC METABOLIC PANEL     Status: Abnormal   Collection Time    08/08/14  4:21 AM      Result Value Ref Range   Sodium 143  137 - 147 mEq/L   Potassium 4.3  3.7 - 5.3 mEq/L   Chloride 104  96 - 112 mEq/L   CO2 26  19 - 32 mEq/L   Glucose, Bld 87  70 - 99 mg/dL   BUN 20  6 - 23 mg/dL   Creatinine, Ser 1.36 (*) 0.50 - 1.35 mg/dL   Calcium 9.1  8.4 - 10.5 mg/dL   GFR calc non Af Amer 63 (*) >90 mL/min   GFR calc Af Amer 73 (*) >90 mL/min   Comment: (NOTE)     The eGFR has been calculated using the CKD EPI equation.     This calculation has not been validated in all clinical situations.     eGFR's persistently <90 mL/min signify possible Chronic Kidney     Disease.   Anion gap 13  5 - 15  TROPONIN I     Status: None   Collection Time    08/08/14 11:55 AM      Result Value Ref Range   Troponin I <0.30  <0.30 ng/mL   Comment:            Due to the release kinetics of cTnI,     a negative result within the first hours     of the onset of symptoms does not  rule out     myocardial infarction with certainty.     If myocardial infarction is still suspected,     repeat the test at appropriate intervals.  SURGICAL PCR SCREEN     Status: None   Collection Time    08/09/14 12:34 AM      Result Value Ref Range   MRSA, PCR NEGATIVE  NEGATIVE   Staphylococcus aureus NEGATIVE  NEGATIVE   Comment:            The Xpert SA Assay (FDA     approved for NASAL specimens     in patients  over 39 years of age),     is one component of     a comprehensive surveillance     program.  Test performance has     been validated by [employer] for patients greater     than or equal to 67 year old.     It is not intended     to diagnose infection nor to     guide or monitor treatment.  BASIC METABOLIC PANEL     Status: Abnormal   Collection Time    08/09/14  6:47 AM      Result Value Ref Range   Sodium 140  137 - 147 mEq/L   Potassium 4.3  3.7 - 5.3 mEq/L   Chloride 102  96 - 112 mEq/L   CO2 28  19 - 32 mEq/L   Glucose, Bld 81  70 - 99 mg/dL   BUN 19  6 - 23 mg/dL   Creatinine, Ser 1.36 (*) 0.50 - 1.35 mg/dL   Calcium 9.1  8.4 - 10.5 mg/dL   GFR calc non Af Amer 63 (*) >90 mL/min   GFR calc Af Amer 73 (*) >90 mL/min   Comment: (NOTE)     The eGFR has been calculated using the CKD EPI equation.     This calculation has not been validated in all clinical situations.     eGFR's persistently <90 mL/min signify possible Chronic Kidney     Disease.   Anion gap 10  5 - 15   Dg Chest 2 View  08/07/2014   CLINICAL DATA:  Hypertension  EXAM: CHEST  2 VIEW  COMPARISON:  10/11/2012  FINDINGS: Cardiomediastinal silhouette is stable. No acute infiltrate or pleural effusion. No pulmonary edema. Bony thorax is unremarkable.  IMPRESSION: No active cardiopulmonary disease.   Electronically Signed   By: Lahoma Crocker M.D.   On: 08/07/2014 12:21   Ct Ankle Left W Wo Contrast  08/09/2014   CLINICAL DATA:  Preoperative planning. Left ankle infection, with incompletely healed distal tibial and fibular fractures.  EXAM: CT OF THE LEFT ANKLE WITHOUT AND WITH CONTRAST  TECHNIQUE: Multidetector CT imaging was performed following the standard protocol before and after bolus administration of intravenous contrast.  CONTRAST:  161m OMNIPAQUE IOHEXOL 300 MG/ML  SOLN  COMPARISON:  None.  FINDINGS: The extent of soft tissue infection is not well characterized on this study. No definite abscess is seen.  There is diffuse soft tissue edema along the anterior aspect of the lower leg, extending about the ankle joint and along the dorsum of the midfoot. There is a somewhat complex appearing ankle joint effusion. Surrounding soft tissue inflammation is seen. Evaluation for osteomyelitis is significantly limited on CT.  A plate and screws are noted along the mid to distal tibia. There is perhaps slight loosening with regard to the  screws at the tibial plafond. Note is made of healing of the fracture, with mild residual displacement; the fracture line is no longer definitely seen. The medullary space is exposed along the distal tibial metadiaphysis, given displacement and rotation of the fracture.  There is also mild displacement with regard to the distal fibular fracture, though it also demonstrates interval healing.  Given the complexity of the fracture at the tibial plafond, screws are partially exposed at the ankle mortise. There is associated cortical irregularity along the talar dome, with subcortical cystic change most prominent at the lateral aspect of the talar dome.  There is no evidence of talar subluxation or significant talar tilt. The subtalar joint is unremarkable in appearance. A plantar calcaneal spur is seen. No additional fractures are identified.  The vasculature is grossly unremarkable in appearance. Visualized flexor and extensor tendons are grossly unremarkable. The peroneus tendons are grossly intact.  IMPRESSION: 1. Distal tibial and fibular fractures have healed, with mild residual displacement. Given displacement and mild rotation of the distal tibial fracture, there is exposure of the medullary space along the distal tibial metadiaphysis. 2. Perhaps slight loosening of the screws at the tibial plafond. Given the complexity of the fracture at the level of the tibial plafond, screws are partially exposed at the ankle mortise. Associated cortical irregularity noted along the tailor dome, with  prominent subcortical cystic change, particularly laterally. 3. Somewhat complex appearing ankle joint effusion, with surrounding soft tissue inflammation. The extent of clinically described soft tissue infection is not well characterized on this study. No definite abscess seen. Evaluation for osteomyelitis is significantly limited on CT. 4. Diffuse soft tissue edema along the anterior aspect of the lower leg, extending about the ankle joint and along the dorsum of the midfoot.   Electronically Signed   By: Garald Balding M.D.   On: 08/09/2014 07:17    ROS No recent fever, bleeding abnormalities, urologic dysfunction, GI problems, or weight gain.   Blood pressure 175/105, pulse 66, temperature 98.4 F (36.9 C), temperature source Oral, resp. rate 18, height '5\' 11"'  (1.803 m), weight 252 lb 6.8 oz (114.5 kg), SpO2 99.00%. Physical Exam  No wheezing RRR S/ NT LLE Healed traumatic wounds, without ecchymosis or rash  Tender  No effusions  Knee stable to varus/ valgus and anterior/posterior stress  Sens DPN, SPN, TN intact  Motor EHL, ext, flex, evers 5/5  DP 2+, PT 2+, No significant edema  Reduced ankle ROM     Assessment/Plan Symptomatic hardware left ankle, now with controlled BP, for surgical removal  I discussed with the patient the risks and benefits of surgery, including the possibility of infection, nerve injury, vessel injury, wound breakdown, arthritis, failure to alleviate symptoms, DVT/ PE, loss of motion, and need for further surgery among others.  He understood these risks and wished to proceed.   Altamese Petersburg, MD [facility] [phone] (p)  08/09/2014, 11:16 AM

## 2014-08-09 NOTE — Progress Notes (Signed)
Returned from [employer], a/ox4, denies nausea, c/o left lower leg pain 7/10. Elevated on pillows and ice pack on, parents at bedside

## 2014-08-09 NOTE — Anesthesia Preprocedure Evaluation (Addendum)
Anesthesia Evaluation  Patient identified by MRN, date of birth, ID band Patient awake    Reviewed: Allergy & Precautions, H&P , NPO status , Patient's Chart, lab work & pertinent test results  History of Anesthesia Complications Negative for: history of anesthetic complications  Airway Mallampati: II TM Distance: >3 FB Neck ROM: Full    Dental  (+) Poor Dentition, Missing, Dental Advisory Given   Pulmonary Current Smoker,          Cardiovascular hypertension,     Neuro/Psych negative neurological ROS  negative psych ROS   GI/Hepatic negative GI ROS, Neg liver ROS,   Endo/Other    Renal/GU Renal InsufficiencyRenal disease     Musculoskeletal   Abdominal   Peds  Hematology   Anesthesia Other Findings   Reproductive/Obstetrics                          Anesthesia Physical Anesthesia Plan  ASA: III  Anesthesia Plan: General   Post-op Pain Management:    Induction: Intravenous  Airway Management Planned: LMA  Additional Equipment:   Intra-op Plan:   Post-operative Plan: Extubation in OR  Informed Consent: I have reviewed the patients History and Physical, chart, labs and discussed the procedure including the risks, benefits and alternatives for the proposed anesthesia with the patient or authorized representative who has indicated his/her understanding and acceptance.   Dental advisory given  Plan Discussed with: CRNA, Anesthesiologist and Surgeon  Anesthesia Plan Comments:        Anesthesia Quick Evaluation

## 2014-08-09 NOTE — Transfer of Care (Signed)
Immediate Anesthesia Transfer of Care Note  Patient: Edwin Rodriguez  Procedure(s) Performed: Procedure(s): HARDWARE REMOVAL LEFT ANKLE (Left)  Patient Location: PACU  Anesthesia Type:General  Level of Consciousness: awake and alert   Airway & Oxygen Therapy: Patient Spontanous Breathing and Patient connected to nasal cannula oxygen  Post-op Assessment: Report given to PACU RN, Post -op Vital signs reviewed and stable and Patient moving all extremities X 4  Post vital signs: Reviewed and stable  Complications: No apparent anesthesia complications

## 2014-08-10 DIAGNOSIS — I129 Hypertensive chronic kidney disease with stage 1 through stage 4 chronic kidney disease, or unspecified chronic kidney disease: Secondary | ICD-10-CM

## 2014-08-10 DIAGNOSIS — F1210 Cannabis abuse, uncomplicated: Secondary | ICD-10-CM

## 2014-08-10 DIAGNOSIS — N189 Chronic kidney disease, unspecified: Secondary | ICD-10-CM

## 2014-08-10 DIAGNOSIS — T8484XA Pain due to internal orthopedic prosthetic devices, implants and grafts, initial encounter: Secondary | ICD-10-CM | POA: Diagnosis present

## 2014-08-10 DIAGNOSIS — F1010 Alcohol abuse, uncomplicated: Secondary | ICD-10-CM

## 2014-08-10 DIAGNOSIS — F1410 Cocaine abuse, uncomplicated: Secondary | ICD-10-CM

## 2014-08-10 LAB — GLUCOSE, CAPILLARY: GLUCOSE-CAPILLARY: 87 mg/dL (ref 70–99)

## 2014-08-10 MED ORDER — OXYCODONE-ACETAMINOPHEN 5-325 MG PO TABS: 1.0000 | ORAL_TABLET | Freq: Four times a day (QID) | ORAL | Status: DC | PRN

## 2014-08-10 MED ORDER — METHOCARBAMOL 500 MG PO TABS: 500.0000 mg | ORAL_TABLET | Freq: Four times a day (QID) | ORAL | Status: DC | PRN

## 2014-08-10 MED ORDER — TRAMADOL HCL 50 MG PO TABS: 50.0000 mg | ORAL_TABLET | Freq: Three times a day (TID) | ORAL | Status: DC | PRN

## 2014-08-10 MED ORDER — AMLODIPINE BESY-BENAZEPRIL HCL 10-40 MG PO CAPS: 1.0000 | ORAL_CAPSULE | Freq: Every day | ORAL | Status: DC

## 2014-08-10 NOTE — Progress Notes (Signed)
[facility] Progress Note  Subjective  Doing ok this am No specific complaints Ortho issues stable  Nsg notes reviewed   Review of Systems  Constitutional: Negative for fever and chills.  Respiratory: Negative for shortness of breath.   Cardiovascular: Negative for chest pain and palpitations.  Gastrointestinal: Negative for nausea, vomiting and abdominal pain.  Musculoskeletal:       L ankle pain      Objective   BP 154/92  Pulse 62  Temp(Src) 98.7 F (37.1 C) (Oral)  Resp 18  Ht 5\' 11"  (1.803 m)  Wt 114.5 kg (252 lb 6.8 oz)  BMI 35.22 kg/m2  SpO2 99%  Intake/Output     09/17 0701 - 09/18 0700 09/18 0701 - 09/19 0700   P.O. 360    I.V. (mL/kg) 1300 (11.4)    IV Piggyback     Total Intake(mL/kg) 1660 (14.5)    Urine (mL/kg/hr) 625 (0.2)    Blood 150 (0.1)    Total Output 775     Net +885            Labs  No new labs   Exam  Gen: awake and alert, NAD, comfortable  Ext:   Left Lower Extremity    Dressing with dried drainage o/w stable   Ext warm   EHL, FHL, lesser toe motor intact   Pt able to perform ankle extension and flexion but restricted    DPN, SPN, TN sensation intact    Swelling stable    Assessment and Plan   POD/HD#: 1   43 y/o black male s/p ROH L ankle for symptomatic HW  1. Symptomatic HW L ankle s/p [facility]  WBAT with crutches, can wean off crutches as pain allows  Can wear a regular shoe for WB activities  Ice and elevate  Dressing change on 08/12/2014- dry dressing and ace  Follow up in 2 weeks for suture removal  2. Hypertensive emergency   Per medicine service   Explained the importance of BP control to pt again and that he needs to find a PCP for long term management   3. PSA  States he can stop using cocaine on his own and doesn't need help  4. DVT/PE prophylaxis  Does not require pharmacologics at dc   5. [place] for [employer] from ortho standpoint  Follow up in 2 weeks    Jari Pigg,  PA-C [facility] [phone] (O) 08/10/2014 8:08 AM

## 2014-08-10 NOTE — Progress Notes (Signed)
Dressing monitored over night - no significant increase in bleeding. On call physician made aware of bleeding at the beginning of the shift. Pt's BP stable. Bleeding within marked lines that dayshift made 1800 9/17. Pt had ice pack on dressing - caused some wetness - dressing is continuing to dry. Will continue to monitor dressing and pass on to dayshift nurse as well.

## 2014-08-10 NOTE — Progress Notes (Signed)
Subjective: Edwin Rodriguez is now post-op day one from his orthopaedic removal of hardware from his left leg. He feels good; he experienced some pain overnight, off and on. Yesterday, he requested to stay overnight, but today he feels ready to go home. He is requesting extra dressing changes (gauze and wraps) for wound care at home.  Objective: Vital signs in last 24 hours: Filed Vitals:   08/09/14 1556 08/09/14 2158 08/10/14 0038 08/10/14 0614  BP: 104/81 146/85 145/82 154/92  Pulse: 97 81 73 62  Temp: 98.5 F (36.9 C) 98.6 F (37 C) 98.2 F (36.8 C) 98.7 F (37.1 C)  TempSrc: Oral Oral Oral Oral  Resp: 16 18 18 18   Height:      Weight:      SpO2: 94% 98% 100% 99%   Weight change:   Intake/Output Summary (Last 24 hours) at 08/10/14 0955 Last data filed at 08/09/14 2304  Gross per 24 hour  Intake   1660 ml  Output    775 ml  Net    885 ml   Physical Exam:  Appearance: in NAD, lying in bed watching a movie, crutches at bedside HEENT: well-healed scar on left cheek, otherwise AT/, PERRL, EOMi  Heart: RRR, normal S1S2  Lungs: CTAB, normal effort  Abdomen: BS+, soft, nontender, no hepatosplenomegaly  Musculoskeletal: LLE wrapped in ace banadage; some bleeding through the gauze and bandage evident; as per nursing, it has not enlarged since it first occurred yesterday. No evident erythema or edema, normal range of motion, distal pulses intact, feet warm and well perfused b/l Neurologic: CN II-XII intact, sensation and strength 5/5 throughout, normal coordination  Skin: no rashes  Lab Results: Basic Metabolic Panel:  Recent Labs Lab 08/08/14 0421 08/09/14 0647  NA 143 140  K 4.3 4.3  CL 104 102  CO2 26 28  GLUCOSE 87 81  BUN 20 19  CREATININE 1.36* 1.36*  CALCIUM 9.1 9.1   Liver Function Tests:  Recent Labs Lab 08/07/14 0941  AST 24  ALT 25  ALKPHOS 77  BILITOT 0.5  PROT 7.7  ALBUMIN 3.8   CBC:  Recent Labs Lab 08/07/14 0941  WBC 4.6  HGB 15.0  HCT  43.0  MCV 94.1  PLT 156   Cardiac Enzymes:  Recent Labs Lab 08/07/14 0927 08/08/14 1155  TROPONINI <0.30 <0.30   BNP:  Recent Labs Lab 08/07/14 0927  PROBNP 1014.0*   Urine Drug Screen: Drugs of Abuse     Component Value Date/Time   LABOPIA NONE DETECTED 08/07/2014 1128   COCAINSCRNUR POSITIVE* 08/07/2014 1128   LABBENZ NONE DETECTED 08/07/2014 1128   AMPHETMU NONE DETECTED 08/07/2014 1128   THCU NONE DETECTED 08/07/2014 1128   LABBARB NONE DETECTED 08/07/2014 1128    Studies/Results: Dg Ankle 2 Views Left  08/09/2014   CLINICAL DATA:  Hardware removal  EXAM: DG C-ARM 61-120 MIN; LEFT ANKLE - 2 VIEW  FLUOROSCOPY TIME:  3 seconds  COMPARISON:  None  FINDINGS: Interval removal of tibial side plate and multiple interlocking screws. Posttraumatic deformity of the distal tibia and fibula.  IMPRESSION: Interval removal of left distal tibial side plate and interlocking screws.   Electronically Signed   By: Kathreen Devoid   On: 08/09/2014 14:16   Ct Ankle Left W Wo Contrast  08/09/2014   CLINICAL DATA:  Preoperative planning. Left ankle infection, with incompletely healed distal tibial and fibular fractures.  EXAM: CT OF THE LEFT ANKLE WITHOUT AND WITH CONTRAST  TECHNIQUE: Multidetector  CT imaging was performed following the standard protocol before and after bolus administration of intravenous contrast.  CONTRAST:  159mL OMNIPAQUE IOHEXOL 300 MG/ML  SOLN  COMPARISON:  None.  FINDINGS: The extent of soft tissue infection is not well characterized on this study. No definite abscess is seen. There is diffuse soft tissue edema along the anterior aspect of the lower leg, extending about the ankle joint and along the dorsum of the midfoot. There is a somewhat complex appearing ankle joint effusion. Surrounding soft tissue inflammation is seen. Evaluation for osteomyelitis is significantly limited on CT.  A plate and screws are noted along the mid to distal tibia. There is perhaps slight loosening with  regard to the screws at the tibial plafond. Note is made of healing of the fracture, with mild residual displacement; the fracture line is no longer definitely seen. The medullary space is exposed along the distal tibial metadiaphysis, given displacement and rotation of the fracture.  There is also mild displacement with regard to the distal fibular fracture, though it also demonstrates interval healing.  Given the complexity of the fracture at the tibial plafond, screws are partially exposed at the ankle mortise. There is associated cortical irregularity along the talar dome, with subcortical cystic change most prominent at the lateral aspect of the talar dome.  There is no evidence of talar subluxation or significant talar tilt. The subtalar joint is unremarkable in appearance. A plantar calcaneal spur is seen. No additional fractures are identified.  The vasculature is grossly unremarkable in appearance. Visualized flexor and extensor tendons are grossly unremarkable. The peroneus tendons are grossly intact.  IMPRESSION: 1. Distal tibial and fibular fractures have healed, with mild residual displacement. Given displacement and mild rotation of the distal tibial fracture, there is exposure of the medullary space along the distal tibial metadiaphysis. 2. Perhaps slight loosening of the screws at the tibial plafond. Given the complexity of the fracture at the level of the tibial plafond, screws are partially exposed at the ankle mortise. Associated cortical irregularity noted along the tailor dome, with prominent subcortical cystic change, particularly laterally. 3. Somewhat complex appearing ankle joint effusion, with surrounding soft tissue inflammation. The extent of clinically described soft tissue infection is not well characterized on this study. No definite abscess seen. Evaluation for osteomyelitis is significantly limited on CT. 4. Diffuse soft tissue edema along the anterior aspect of the lower leg,  extending about the ankle joint and along the dorsum of the midfoot.   Electronically Signed   By: Garald Balding M.D.   On: 08/09/2014 07:17   Dg C-arm 1-60 Min  08/09/2014   CLINICAL DATA:  Hardware removal  EXAM: DG C-ARM 61-120 MIN; LEFT ANKLE - 2 VIEW  FLUOROSCOPY TIME:  3 seconds  COMPARISON:  None  FINDINGS: Interval removal of tibial side plate and multiple interlocking screws. Posttraumatic deformity of the distal tibia and fibula.  IMPRESSION: Interval removal of left distal tibial side plate and interlocking screws.   Electronically Signed   By: Kathreen Devoid   On: 08/09/2014 14:16   Medications: I have reviewed the patient's current medications. Scheduled Meds: . amLODipine  10 mg Oral Daily  . folic acid  1 mg Oral Daily  . multivitamin with minerals  1 tablet Oral Daily  . sodium chloride  3 mL Intravenous Q12H  . thiamine  100 mg Oral Daily   Continuous Infusions: . lactated ringers Stopped (08/09/14 1800)   PRN Meds:.acetaminophen, hydrALAZINE, HYDROmorphone (DILAUDID) injection, methocarbamol, oxyCODONE,  oxyCODONE-acetaminophen Assessment/Plan: Principal Problem:   Hypertensive emergency Active Problems:   Polysubstance abuse   Hypertensive renal disease   Painful orthopaedic hardware  Edwin Rodriguez is a 43 yo man with a past medical history of poorly controlled hypertension who was sent to the ED for evaluation of a BP to 240/160 that was noted in the outpatient setting; he had been in pre-op for an elective orthopaedic surgery. This hypertension was corrected with hydralazine, but the patient had an elevated creatinine; he was therefore admitted for observation. By history, it was clear that the patient had not been taking his medications for at least 3 weeks. His symptoms of dizziness, blurred vision and headache were likely the result of very high blood pressures as an outpatient and resolved with correction of his pressures (which have remained primarily in the 110s-150s  over 80s). In order to avoid another cancelled surgery, the patient received his LLE removal of hardware surgery as an inpatient yesterday. It had not complications and the patient is now ready for discharge home with a new BP regimen, crutches and WBAT.  Hypertensive Emergency: Pressures as high as 199/146 in the hospital and 230/140 by report in outpatient setting today with associated symptoms, a bump in creatinine to 1.37 (from 0.87-1.00 at baseline) and BNP 1014. EKG unchanged from 09/2012 EKG. BP has fallen to 160s/100s and symptoms have all resolved. Initially considered broader workup if hypertension persisted on medication, but he responded to low dose amlodipine. The patient is a poor candidate for BB (cocaine abuse) and clonidine/hydralazine (long history of noncompliance as outpatient). Initially, ACE/ARB were avoided in the context of his elevated creatinine. Echo showed EF 65-70% with moderate LV hypertrophy (EF is actually improved from 2013 echo). - Will send home on new rx of Lotrel (amlodipine / benazepril) with a new PCP for BP follow-up  CKD: Creatinine elevated to 1.37-->1.36-->1.36 off of home ARB; appears more likely to be his new baseline (CKD) rather than AKI. Urine sodium 117, urine creatinine 115 and FeNA 1%. UA negative.   LLE ORIF Internal Hardware: Completed without complication. - F/u with Dr. Marcelino Scot in 2 weeks for suture removal - Dressing change on 08/12/14; needs supplies (dry dressing and ace) - Percocet prescribed by orthopaedics for pain control - WBAT and given crutches by orthopaedics; can wean off as pain allows, wear regular shoes  Polysubstance Abuse: Cocaine reportedly last used 2 days PTA; +cocaine on UDS - Patient verbalizes his understanding of the dangers of cocaine and alcohol abuse for him; counseling was provided on this daily - Offered outpatient counseling / rehab; patient is not interested at this time. He believes he can stop using cocaine and  alcohol on his own - CIWA checks (without ativan); score 5-->0  DVT Ppx:  - SCDs  Diet:  - Heart healthy  Dispo: Disposition is deferred at this time, awaiting improvement of current medical problems.  Anticipated discharge in approximately 0 day(s).   The patient does not have a current PCP (No Pcp Per Patient) and does not need an [facility] hospital follow-up appointment after discharge.  The patient does not have transportation limitations that hinder transportation to clinic appointments.  .Services Needed at time of discharge: Y = Yes, Blank = No PT:   OT:   RN:   Equipment:   Other:     LOS: 3 days   Drucilla Schmidt, MD 08/10/2014, 9:55 AM

## 2014-08-10 NOTE — Progress Notes (Signed)
On call physician paged regarding blood noted on pt's dressing. Dayshift nurse state that pt stood up when ortho tech placed ortho boot on the patient. After the patient go back into bed, blood was noticed on the pt's leg dressing. Dayshift nurse marked the blood. The blood has not increased since dayshift marked the dressing. On call physician notified of bleeding - [place] heparin DCd at this time and SCDs ordered. Will continue to monitor pt and dressing.

## 2014-08-10 NOTE — Progress Notes (Signed)
Pt given discharge instructions and prescriptions.  Pt denied any needs at this time.  Will reinforce dressing to LLE.

## 2014-08-10 NOTE — Op Note (Signed)
NAMESHANON, BECVAR             ACCOUNT NO.:  [id number]  MEDICAL RECORD NO.:  [id number]  LOCATION:                                 FACILITY:  PHYSICIAN:  Astrid Divine. Marcelino Scot, M.D. DATE OF BIRTH:  28-Jun-1971  DATE OF PROCEDURE:  08/09/2014 DATE OF DISCHARGE:  08/07/2014                              OPERATIVE REPORT   PREOPERATIVE DIAGNOSES: 1. Symptomatic hardware, left ankle. 2. Posttraumatic ankle arthritis.  POSTOPERATIVE DIAGNOSES: 1. Symptomatic hardware, left ankle. 2. Posttraumatic ankle arthritis. 3. Extensive tethering of the EHL tendon.  PROCEDURE: 1. Removal of hardware, left ankle. 2. Arthrotomy of left ankle with debridement. 3. Tenolysis of the EHL tendon.  SURGEON:  Astrid Divine. Marcelino Scot, M.D.  ASSISTANT:  Jari Pigg, PA-C.  ANESTHESIA:  General.  COMPLICATIONS:  None.  I/O:  1300 mL crystalloid, out 150 mL.  SPECIMENS:  None.  TOURNIQUET:  49 minutes.  DISPOSITION:  PACU.  CONDITION:  Stable.  BRIEF SUMMARY OF INDICATION FOR PROCEDURE:  Edwin Rodriguez is a 43 year old male who underwent repair of a complicated left pilon fracture involving his tibia and fibula.  This was done remotely 2 days ago.  He presented for elective removal of migrated hardware.  Because of his rather severe diastolic hypertension, the patient was unable to undergo surgery.  He was admitted and managed by the Medical Service, who were able to much better control his diastolic pressure and I felt he was cleared for surgery and could return.  This was discussed with the anesthesiologist who is also the same one who had seen him on the day that he was canceled and we felt comfortable securing continuing.  I did discuss with the patient the possibility of risk associated with his hypertension including stroke, heart attack, renal injury, and others. We also discussed nerve injury, vessel injury from his surgery as well as DVT, PE, infection, failure to alleviate symptoms in  many others.  He acknowledged these risks and did wish to proceed.  BRIEF DESCRIPTION OF PROCEDURE:  Mr. Whitehorn was taken to the operating room after administration of 2 g of Ancef.  His left lower extremity was prepped and draped in usual sterile fashion.  Tourniquet was initially left down.  We made the distal and proximal extent of his incision and began to dissect down to the plate.  We encountered a 5-8 mm thick area of eschar and fibrinous material that had to be incised and then excised with a #10 blade.  I then continued with elevation, around the plate. We did not encounter any significant bleeding.  I was able to develop the plane between his overlying soft tissue structures and the fibrous scar.  On the x-ray, it was clear that there was additional debris, posttraumatic arthritic change down in the joint itself and consequently I continued with use of a rongeur after incising the joint and removed some of this posttraumatic calcification and bone and irrigated the joint thoroughly while taking through range of motion.  I then returned back to the plate exposing all the screw heads removing them one by one, and passing a Cobb.  With the Cobb, turned away from periosteum to avoid any stripping  and was able to mobilize the plate in sequential fashion. Once this was done, it was then grasped and removed from the wound. Again, the procedure was quite difficult because of the massive layer of scarring but no complications occurred.  Tourniquet was deflated.  There was no significant bleeding.  Wound was irrigated thoroughly and closed in a standard layered fashion with 2-0 Vicryl and 3-0 nylon.  It should be noted that I did elevate the tourniquet after initial dissection past the neurovascular bundle.  Ainsley Spinner, PA-C assisted me throughout and was necessary because of the difficulty of retraction and mobilization. He did assist with wound closure as well.  There was also noted  that the EHL tendon was tethered with multiple soft tissue adhesions within the sheath.  I spent considerable time going proximally and distally along this tendon releasing these adhesions until it was freely mobile which greatly improved his motion and also assist with his function.  PROGNOSIS:  Mr. Matt will be examined for intact function most particularly his dorsalis pedis and deep peroneal nerve, given the severity of his scar.  We will encourage him to continue with mobilization of the great toe after the tenolysis that was performed today, it should help considerably.  He is at risk of reaccumulation of arthritic symptoms in the ankle joint and will also be encouraged in a high-resolution low-impact, strengthening program for his ankle.  Future interventions could include injection, eventual fusion.  He will be on DVT prophylaxis while in the hospital and likely discharged on aspirin daily but as he will be weightbearing as tolerated and because of concerns about his diastolic pressure and the potential for stroke, we will discuss this with his medical team.     Astrid Divine. Marcelino Scot, M.D.     MHH/MEDQ  D:  08/09/2014  T:  08/10/2014  Job:  [id number]

## 2014-08-10 NOTE — Discharge Instructions (Addendum)
You had a hypertensive emergency; this means that your blood pressure was so high that your organs were being damaged. It is very important that you take you blood pressure medications.   Cocaine abuse also increases your risk for organ damage.   You were put on two new blood pressure medications, to be started on your first day out of the hospital (lisinopril and amlodipine).  Hypertension Hypertension, commonly called high blood pressure, is when the force of blood pumping through your arteries is too strong. Your arteries are the blood vessels that carry blood from your heart throughout your body. A blood pressure reading consists of a higher number over a lower number, such as 110/72. The higher number (systolic) is the pressure inside your arteries when your heart pumps. The lower number (diastolic) is the pressure inside your arteries when your heart relaxes. Ideally you want your blood pressure below 120/80. Hypertension forces your heart to work harder to pump blood. Your arteries may become narrow or stiff. Having hypertension puts you at risk for heart disease, stroke, and other problems.  RISK FACTORS Some risk factors for high blood pressure are controllable. Others are not.  Risk factors you cannot control include:   Race. You may be at higher risk if you are African American.  Age. Risk increases with age.  Gender. Men are at higher risk than women before age 28 years. After age 41, women are at higher risk than men. Risk factors you can control include:  Not getting enough exercise or physical activity.  Being overweight.  Getting too much fat, sugar, calories, or salt in your diet.  Drinking too much alcohol. SIGNS AND SYMPTOMS Hypertension does not usually cause signs or symptoms. Extremely high blood pressure (hypertensive crisis) may cause headache, anxiety, shortness of breath, and nosebleed. DIAGNOSIS  To check if you have hypertension, your health care provider  will measure your blood pressure while you are seated, with your arm held at the level of your heart. It should be measured at least twice using the same arm. Certain conditions can cause a difference in blood pressure between your right and left arms. A blood pressure reading that is higher than normal on one occasion does not mean that you need treatment. If one blood pressure reading is high, ask your health care provider about having it checked again. TREATMENT  Treating high blood pressure includes making lifestyle changes and possibly taking medicine. Living a healthy lifestyle can help lower high blood pressure. You may need to change some of your habits. Lifestyle changes may include:  Following the DASH diet. This diet is high in fruits, vegetables, and whole grains. It is low in salt, red meat, and added sugars.  Getting at least 2 hours of brisk physical activity every week.  Losing weight if necessary.  Not smoking.  Limiting alcoholic beverages.  Learning ways to reduce stress. If lifestyle changes are not enough to get your blood pressure under control, your health care provider may prescribe medicine. You may need to take more than one. Work closely with your health care provider to understand the risks and benefits. HOME CARE INSTRUCTIONS  Have your blood pressure rechecked as directed by your health care provider.   Take medicines only as directed by your health care provider. Follow the directions carefully. Blood pressure medicines must be taken as prescribed. The medicine does not work as well when you skip doses. Skipping doses also puts you at risk for problems.   Do  not smoke.   Monitor your blood pressure at home as directed by your health care provider. SEEK MEDICAL CARE IF:   You think you are having a reaction to medicines taken.  You have recurrent headaches or feel dizzy.  You have swelling in your ankles.  You have trouble with your vision. SEEK  IMMEDIATE MEDICAL CARE IF:  You develop a severe headache or confusion.  You have unusual weakness, numbness, or feel faint.  You have severe chest or abdominal pain.  You vomit repeatedly.  You have trouble breathing. MAKE SURE YOU:   Understand these instructions.  Will watch your condition.  Will get help right away if you are not doing well or get worse. Document Released: 11/09/2005 Document Revised: 03/26/2014 Document Reviewed: 09/01/2013 [facility] Patient Information 2015 [place], [place]. This information is not intended to replace advice given to you by your health care provider. Make sure you discuss any questions you have with your health care provider.  [facility] Discharge Instructions,   General Discharge Instructions  WEIGHT BEARING STATUS: Weightbear as tolerated Left leg, wean from crutches as pain allows   RANGE OF MOTION/ACTIVITY: range of motion as tolerated Left ankle and knee   Diet: as you were eating previously.  Can use over the counter stool softeners and bowel preparations, such as Miralax, to help with bowel movements.  Narcotics can be constipating.  Be sure to drink plenty of fluids  STOP SMOKING OR USING NICOTINE PRODUCTS!!!!  As discussed nicotine severely impairs your body's ability to heal surgical and traumatic wounds but also impairs bone healing.  Wounds and bone heal by forming microscopic blood vessels (angiogenesis) and nicotine is a vasoconstrictor (essentially, shrinks blood vessels).  Therefore, if vasoconstriction occurs to these microscopic blood vessels they essentially disappear and are unable to deliver necessary nutrients to the healing tissue.  This is one modifiable factor that you can do to dramatically increase your chances of healing your injury.    (This means no smoking, no nicotine gum, patches, etc)  PAIN MEDICATION USE AND EXPECTATIONS  You have likely been given narcotic medications to help control your  pain.  After a traumatic event that results in an fracture (broken bone) with or without surgery, it is ok to use narcotic pain medications to help control one's pain.  We understand that everyone responds to pain differently and each individual patient will be evaluated on a regular basis for the continued need for narcotic medications. Ideally, narcotic medication use should last no more than 6-8 weeks (coinciding with fracture healing).   As a patient it is your responsibility as well to monitor narcotic medication use and report the amount and frequency you use these medications when you come to your office visit.   We would also advise that if you are using narcotic medications, you should take a dose prior to therapy to maximize you participation.  IF YOU ARE ON NARCOTIC MEDICATIONS IT IS NOT PERMISSIBLE TO OPERATE A MOTOR VEHICLE (MOTORCYCLE/CAR/TRUCK/MOPED) OR HEAVY MACHINERY DO NOT MIX NARCOTICS WITH OTHER CNS (CENTRAL NERVOUS SYSTEM) DEPRESSANTS SUCH AS ALCOHOL       ICE AND ELEVATE INJURED/OPERATIVE EXTREMITY  Using ice and elevating the injured extremity above your heart can help with swelling and pain control.  Icing in a pulsatile fashion, such as 20 minutes on and 20 minutes off, can be followed.    Do not place ice directly on skin. Make sure there is a barrier between to skin and the  ice pack.    Using frozen items such as frozen peas works well as the conform nicely to the are that needs to be iced.  USE AN ACE WRAP OR TED HOSE FOR SWELLING CONTROL  In addition to icing and elevation, Ace wraps or TED hose are used to help limit and resolve swelling.  It is recommended to use Ace wraps or TED hose until you are informed to stop.    When using Ace Wraps start the wrapping distally (farthest away from the body) and wrap proximally (closer to the body)   Example: If you had surgery on your leg or thing and you do not have a splint on, start the ace wrap at the toes and work your way  up to the thigh        If you had surgery on your upper extremity and do not have a splint on, start the ace wrap at your fingers and work your way up to the upper arm  [place]: [phone]   Discharge Wound Care Instructions  Do NOT apply any ointments, solutions or lotions to pin sites or surgical wounds.  These prevent needed drainage and even though solutions like hydrogen peroxide kill bacteria, they also damage cells lining the pin sites that help fight infection.  Applying lotions or ointments can keep the wounds moist and can cause them to breakdown and open up as well. This can increase the risk for infection. When in doubt call the office.  Surgical incisions should be dressed daily.  If any drainage is noted, use one layer of adaptic, then gauze, Kerlix, and an ace wrap.  Once the incision is completely dry and without drainage, it may be left open to air out.  Showering may begin 36-48 hours later.  Cleaning gently with soap and water.  Traumatic wounds should be dressed daily as well.    One layer of adaptic, gauze, Kerlix, then ace wrap.  The adaptic can be discontinued once the draining has ceased    If you have a wet to dry dressing: wet the gauze with saline the squeeze as much saline out so the gauze is moist (not soaking wet), place moistened gauze over wound, then place a dry gauze over the moist one, followed by Kerlix wrap, then ace wrap.

## 2014-08-10 NOTE — Evaluation (Signed)
Physical Therapy Evaluation Patient Details Name: Edwin Rodriguez MRN: [id number] DOB: 08/11/1971 Today's Date: 08/10/2014   History of Present Illness  43 y/o black male s/p ROH L ankle for symptomatic HW  Clinical Impression  Patient evaluated by Physical Therapy with no further acute PT needs identified. All education has been completed and the patient has no further questions. Pt has been ambulating on crutches for several weeks. Pt demo good technique; at supervision level for safety only but does have 24/7(A) at home. See below for any follow-up Physial Therapy or equipment needs. PT is signing off. Thank you for this referral.     Follow Up Recommendations No PT follow up;Supervision - Intermittent (OPPT when MD approves)    Equipment Recommendations  None recommended by PT    Recommendations for Other Services       Precautions / Restrictions Precautions Precautions: None Restrictions Weight Bearing Restrictions: Yes LLE Weight Bearing: Weight bearing as tolerated      Mobility  Bed Mobility Overal bed mobility: Modified Independent             General bed mobility comments: incr time due to pain  Transfers Overall transfer level: Modified independent Equipment used: Crutches             General transfer comment: pt demo good technique with crutches; maintaining NWB on Lt LE due to pain   Ambulation/Gait Ambulation/Gait assistance: Supervision Ambulation Distance (Feet): 50 Feet Assistive device: Crutches Gait Pattern/deviations: Step-to pattern;Antalgic Gait velocity: decr due to pain Gait velocity interpretation: Below normal speed for age/gender General Gait Details: pt limiting WB through lt LE due to pain; supervision for safety and minc ues for technique with crutches; limited due to pain  Stairs            Wheelchair Mobility    Modified Rankin (Stroke Patients Only)       Balance Overall balance assessment: No apparent balance  deficits (not formally assessed)                                           Pertinent Vitals/Pain Pain Assessment: 0-10 Pain Score: 7  Pain Location: Lt ankle Pain Descriptors / Indicators: Constant;Throbbing Pain Intervention(s): Limited activity within patient's tolerance;Repositioned;Other (comment);Premedicated before session (elevated )    Home Living Family/patient expects to be discharged to:: Private residence Living Arrangements: Other relatives ([place]) Available Help at Discharge: Family;Available 24 hours/day Type of Home: Mobile home Home Access: Level entry     Home Layout: One level Home Equipment: Crutches;Wheelchair - manual      Prior Function Level of Independence: Independent               Hand Dominance   Dominant Hand: Left    Extremity/Trunk Assessment   Upper Extremity Assessment: Defer to OT evaluation           Lower Extremity Assessment: LLE deficits/detail   LLE Deficits / Details: Lt ankle limited  Cervical / Trunk Assessment: Normal  Communication   Communication: No difficulties  Cognition Arousal/Alertness: Awake/alert Behavior During Therapy: WFL for tasks assessed/performed Overall Cognitive Status: Within Functional Limits for tasks assessed                      General Comments      Exercises        Assessment/Plan    PT Assessment  Patient needs continued PT services  PT Diagnosis Difficulty walking;Acute pain   PT Problem List Pain;Decreased range of motion;Decreased activity tolerance  PT Treatment Interventions     PT Goals (Current goals can be found in the Care Plan section) Acute Rehab PT Goals Patient Stated Goal: to go home and get some sleep PT Goal Formulation: No goals set, d/c therapy    Frequency     Barriers to discharge        Co-evaluation               End of Session Equipment Utilized During Treatment: Gait belt Activity Tolerance: Patient limited by  pain Patient left: in bed;with call bell/phone within reach;with nursing/sitter in room Nurse Communication: Mobility status;Weight bearing status         Time: 1018-1030 PT Time Calculation (min): 12 min   Charges:   PT Evaluation $Initial PT Evaluation Tier I: 1 Procedure PT Treatments $Gait Training: 8-22 mins   PT G CodesGustavus Bryant, [place] 08/10/2014, 11:51 AM

## 2014-08-10 NOTE — Progress Notes (Signed)
I have reviewed and discussed in detail with Mr. Edwin Rodriguez the patient's current condition, examination findings, and I formulated the plan outlined above.  Altamese Bourbon, MD [facility] [phone] (p)

## 2014-08-13 ENCOUNTER — Encounter (HOSPITAL_COMMUNITY): Payer: Self-pay | Admitting: Orthopedic Surgery

## 2014-12-06 ENCOUNTER — Encounter (HOSPITAL_COMMUNITY): Payer: Self-pay | Admitting: Orthopedic Surgery

## 2017-12-07 ENCOUNTER — Emergency Department (HOSPITAL_COMMUNITY): Payer: Medicaid Other

## 2017-12-07 ENCOUNTER — Encounter (HOSPITAL_COMMUNITY): Payer: Self-pay

## 2017-12-07 DIAGNOSIS — F17210 Nicotine dependence, cigarettes, uncomplicated: Secondary | ICD-10-CM | POA: Diagnosis not present

## 2017-12-07 DIAGNOSIS — J40 Bronchitis, not specified as acute or chronic: Secondary | ICD-10-CM | POA: Insufficient documentation

## 2017-12-07 DIAGNOSIS — I129 Hypertensive chronic kidney disease with stage 1 through stage 4 chronic kidney disease, or unspecified chronic kidney disease: Secondary | ICD-10-CM | POA: Insufficient documentation

## 2017-12-07 DIAGNOSIS — Z79899 Other long term (current) drug therapy: Secondary | ICD-10-CM | POA: Diagnosis not present

## 2017-12-07 DIAGNOSIS — N189 Chronic kidney disease, unspecified: Secondary | ICD-10-CM | POA: Diagnosis not present

## 2017-12-07 DIAGNOSIS — R0602 Shortness of breath: Secondary | ICD-10-CM | POA: Diagnosis present

## 2017-12-07 LAB — BASIC METABOLIC PANEL
Anion gap: 14 (ref 5–15)
BUN: 18 mg/dL (ref 6–20)
CHLORIDE: 103 mmol/L (ref 101–111)
CO2: 20 mmol/L — ABNORMAL LOW (ref 22–32)
Calcium: 9.7 mg/dL (ref 8.9–10.3)
Creatinine, Ser: 1.39 mg/dL — ABNORMAL HIGH (ref 0.61–1.24)
GFR calc Af Amer: >60 mL/min (ref >60)
GFR calc non Af Amer: 60 mL/min — ABNORMAL LOW (ref >60)
Glucose, Bld: 101 mg/dL — ABNORMAL HIGH (ref 65–99)
POTASSIUM: 4.5 mmol/L (ref 3.5–5.1)
SODIUM: 137 mmol/L (ref 135–145)

## 2017-12-07 LAB — CBC
HCT: 42.8 % (ref 39.0–52.0)
Hemoglobin: 14.7 g/dL (ref 13.0–17.0)
MCH: 31.2 pg (ref 26.0–34.0)
MCHC: 34.3 g/dL (ref 30.0–36.0)
MCV: 90.9 fL (ref 78.0–100.0)
Platelets: 256 10*3/uL (ref 150–400)
RBC: 4.71 MIL/uL (ref 4.22–5.81)
RDW: 13.9 % (ref 11.5–15.5)
WBC: 6.8 10*3/uL (ref 4.0–10.5)

## 2017-12-07 MED ORDER — ALBUTEROL SULFATE (2.5 MG/3ML) 0.083% IN NEBU
5.0000 mg | INHALATION_SOLUTION | Freq: Once | RESPIRATORY_TRACT | Status: AC
  Administered 2017-12-07: 5 mg via RESPIRATORY_TRACT
  Filled 2017-12-07: qty 6

## 2017-12-07 NOTE — ED Triage Notes (Signed)
Pt states that he has had a cough for two weeks, along with SOB, seen PCP today and then SOB got worse during the night, rhonchi in all lobes

## 2017-12-07 NOTE — ED Triage Notes (Addendum)
Pt with hx of PE, not on blood thinners, denies CP

## 2017-12-08 ENCOUNTER — Emergency Department (HOSPITAL_COMMUNITY)
Admission: EM | Admit: 2017-12-08 | Discharge: 2017-12-08 | Disposition: A | Discharge status: Home / Self Care | Payer: Medicaid Other | Attending: Emergency Medicine | Admitting: Emergency Medicine

## 2017-12-08 DIAGNOSIS — J40 Bronchitis, not specified as acute or chronic: Secondary | ICD-10-CM

## 2017-12-08 MED ORDER — DEXAMETHASONE SODIUM PHOSPHATE 10 MG/ML IJ SOLN
10.0000 mg | Freq: Once | INTRAMUSCULAR | Status: AC
  Administered 2017-12-08: 10 mg via INTRAVENOUS
  Filled 2017-12-08: qty 1

## 2017-12-08 MED ORDER — ALBUTEROL SULFATE (2.5 MG/3ML) 0.083% IN NEBU
5.0000 mg | INHALATION_SOLUTION | Freq: Once | RESPIRATORY_TRACT | Status: AC
Start: 2017-12-08 — End: 2017-12-08
  Administered 2017-12-08: 5 mg via RESPIRATORY_TRACT
  Filled 2017-12-08: qty 6

## 2017-12-08 MED ORDER — ALBUTEROL SULFATE (2.5 MG/3ML) 0.083% IN NEBU
5.0000 mg | INHALATION_SOLUTION | Freq: Once | RESPIRATORY_TRACT | Status: AC
  Administered 2017-12-08: 5 mg via RESPIRATORY_TRACT
  Filled 2017-12-08: qty 6

## 2017-12-08 MED ORDER — IPRATROPIUM BROMIDE 0.02 % IN SOLN
0.5000 mg | Freq: Once | RESPIRATORY_TRACT | Status: AC
  Administered 2017-12-08: 0.5 mg via RESPIRATORY_TRACT
  Filled 2017-12-08: qty 2.5

## 2017-12-08 MED ORDER — ALBUTEROL SULFATE HFA 108 (90 BASE) MCG/ACT IN AERS
2.0000 | INHALATION_SPRAY | RESPIRATORY_TRACT | Status: DC | PRN
  Filled 2017-12-08: qty 6.7

## 2017-12-08 NOTE — ED Notes (Signed)
PT states understanding of care given, follow up care, and medication prescribed. PT ambulated from ED to car with a steady gait. 
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
## 2018-09-06 DIAGNOSIS — I517 Cardiomegaly: Secondary | ICD-10-CM

## 2018-10-05 ENCOUNTER — Other Ambulatory Visit: Payer: Self-pay

## 2018-10-05 ENCOUNTER — Encounter (HOSPITAL_COMMUNITY): Payer: Self-pay | Admitting: Emergency Medicine

## 2018-10-05 ENCOUNTER — Ambulatory Visit (HOSPITAL_COMMUNITY)
Admission: EM | Admit: 2018-10-05 | Discharge: 2018-10-05 | Disposition: A | Discharge status: Home / Self Care | Payer: Medicaid Other | Attending: Family Medicine | Admitting: Family Medicine

## 2018-10-05 DIAGNOSIS — L0201 Cutaneous abscess of face: Secondary | ICD-10-CM | POA: Diagnosis not present

## 2018-10-05 MED ORDER — DOXYCYCLINE HYCLATE 100 MG PO CAPS: 100.0000 mg | ORAL_CAPSULE | Freq: Two times a day (BID) | ORAL | 0 refills | Status: DC

## 2018-10-05 NOTE — ED Provider Notes (Signed)
[facility]   [id number] 10/05/18 Arrival Time: 5638   VF:[id number]  SUBJECTIVE:  Edwin Condon Sr. is a 47 y.o. male who presents with a possible abscess to the right side of his face that began gradually 1 week ago.  Denies trauma or precipitating event.  Reports pain.  Has tried topical alcohol and warm compresses without relief.  Reports similar symptoms with abscess in axilla.  Denies fever, chills, nausea, vomiting, or drainage.  ROS: As per HPI.  Past Medical History:  Diagnosis Date  . Arthritis    "knees" (08/07/2014)  . Chronic kidney disease    "borderline"  . DVT (deep venous thrombosis) ([place])   . Fracture of leg    left lower  . History of blood transfusion    "related to blood loss when I had surgery"  . Hypertension    "does not have primary doctor at this time"  . Osteomyelitis of left leg (HCC)   . PE (pulmonary embolism)    Past Surgical History:  Procedure Laterality Date  . APPLICATION OF WOUND VAC  10/17/2012   Procedure: APPLICATION OF WOUND VAC;  Surgeon: Rozanna Box, MD;  Location: [facility];  Service: Orthopedics;  Laterality: Left;  re-application of wound vac medial aspect of left lower leg  . EXTERNAL FIXATION REMOVAL Left 01/19/2013   Procedure: REMOVAL EXTERNAL FIXATION LEG;  Surgeon: Rozanna Box, MD;  Location: [facility];  Service: Orthopedics;  Laterality: Left;  . HARDWARE REMOVAL Left 08/09/2014   Procedure: HARDWARE REMOVAL LEFT ANKLE;  Surgeon: Rozanna Box, MD;  Location: [facility];  Service: Orthopedics;  Laterality: Left;  . HIP SURGERY Bilateral 1983   "used bones from my hips for my kneecaps"  . I&D EXTREMITY  10/14/2012   Procedure: IRRIGATION AND DEBRIDEMENT EXTREMITY;  Surgeon: Rozanna Box, MD;  Location: [facility];  Service: Orthopedics;  Laterality: Left;  with vac change   . KNEE HARDWARE REMOVAL     due to infection   . ORIF ANKLE FRACTURE Left 01/19/2013   Procedure: OPEN REDUCTION INTERNAL FIXATION (ORIF)  PILON ;  Surgeon: Rozanna Box, MD;  Location: [facility];  Service: Orthopedics;  Laterality: Left;  Repair of tibial shaft non union left; Partial excision of bone for osteomyelitis.  Marland Kitchen ORIF TIBIA FRACTURE  10/11/2012   Procedure: OPEN REDUCTION INTERNAL FIXATION (ORIF) TIBIA FRACTURE;  Surgeon: Rozanna Box, MD;  Location: [facility];  Service: Orthopedics;  Laterality: Left;  . PATELLA FRACTURE SURGERY Bilateral 1983  . SKIN SPLIT GRAFT  10/17/2012   Procedure: SKIN GRAFT SPLIT THICKNESS;  Surgeon: Rozanna Box, MD;  Location: [facility];  Service: Orthopedics;  Laterality: Left;  Split thickness skin graft left lower leg from Right Thigh   No Known Allergies No current facility-administered medications on file prior to encounter.    Current Outpatient Medications on File Prior to Encounter  Medication Sig Dispense Refill  . Amlodipine-Valsartan-HCTZ (EXFORGE HCT) 10-320-25 MG TABS Take 1 tablet by mouth daily.    . methocarbamol (ROBAXIN) 500 MG tablet Take 1-2 tablets (500-1,000 mg total) by mouth every 6 (six) hours as needed for muscle spasms. 60 tablet 0  . omeprazole (PRILOSEC) 20 MG capsule Take 20 mg by mouth daily.    Marland Kitchen oxyCODONE-acetaminophen (PERCOCET/ROXICET) 5-325 MG per tablet Take 1-2 tablets by mouth every 6 (six) hours as needed for moderate pain or severe pain. 60 tablet 0  . tiZANidine (ZANAFLEX) 4 MG tablet Take 4 mg by  mouth 2 (two) times daily.     Social History   Socioeconomic History  . Marital status: Single    Spouse name: Not on file  . Number of children: Not on file  . Years of education: Not on file  . Highest education level: Not on file  Occupational History  . Not on file  Social Needs  . Financial resource strain: Not on file  . Food insecurity:    Worry: Not on file    Inability: Not on file  . Transportation needs:    Medical: Not on file    Non-medical: Not on file  Tobacco Use  . Smoking status: Current Every Day Smoker    Packs/day: 0.25     Years: 24.00    Pack years: 6.00    Types: Cigarettes  . Smokeless tobacco: Never Used  Substance and Sexual Activity  . Alcohol use: Yes    Alcohol/week: 1.0 standard drinks    Types: 1 Cans of beer per week  . Drug use: Yes    Types: Marijuana, Cocaine    Comment: 08/07/2014 "stopped using drugs last week"  . Sexual activity: Not Currently  Lifestyle  . Physical activity:    Days per week: Not on file    Minutes per session: Not on file  . Stress: Not on file  Relationships  . Social connections:    Talks on phone: Not on file    Gets together: Not on file    Attends religious service: Not on file    Active member of club or organization: Not on file    Attends meetings of clubs or organizations: Not on file    Relationship status: Not on file  . Intimate partner violence:    Fear of current or ex partner: Not on file    Emotionally abused: Not on file    Physically abused: Not on file    Forced sexual activity: Not on file  Other Topics Concern  . Not on file  Social History Narrative   Pt does not work, has not for some time.  Has applied for disability   Lives in [place]   Family History  Problem Relation Age of Onset  . Hypertension Other     OBJECTIVE:  Vitals:   10/05/18 1511  BP: 129/85  Pulse: 96  Temp: 98.8 F (37.1 C)  TempSrc: Oral  SpO2: 99%     General appearance: alert; no distress CV: RRR Lungs: CTAB; normal respiratory effort Skin: 3.5 cm induration of his  Right temple; tender to touch; no active drainage Psychological: alert and cooperative; normal mood and affect      Procedure: Verbal consent obtained. Area over induration cleaned with betadine. Lidocaine 2% without epinephrine used to obtain local anesthesia. The most fluctuant portion of the abscess was incised with a #11 blade scalpel. Purulent drainage expressed.  Abscess cavity explored and evacuated. Loculations broken up with a curved hemostat as best as possible given  patient discomfort. Cavity packed with packing material and dressed with a clean gauze dressing. Minimal bleeding. No complications. Patient tolerated well.   ASSESSMENT & PLAN:  1. Abscess of face     Meds ordered this encounter  Medications  . doxycycline (VIBRAMYCIN) 100 MG capsule    Sig: Take 1 capsule (100 mg total) by mouth 2 (two) times daily.    Dispense:  20 capsule    Refill:  0    Order Specific Question:   Supervising Provider  AnswerWynona Luna [[id number]]   Incision and drainage performed. Packing placed.  Bandage applied Return in 48 hours to have packing removed or if you are comfortable, you may remove packing at home.   After packing is removed then you may wash with warm water and mild soap and apply warm compresses to encourage continued drainage. Doxycycline prescribed.   Take antibiotic as prescribed and to completion Follow up here or with PCP if symptoms persists Return or go to the ED if you have any new or worsening symptoms increased pain, redness, swelling, fever, chills, nausea, vomiting, etc...  Reviewed expectations re: course of current medical issues. Questions answered. Outlined signs and symptoms indicating need for more acute intervention. Patient verbalized understanding. After Visit Summary given.          Lestine Box, PA-C 10/05/18 1558

## 2018-10-05 NOTE — Discharge Instructions (Addendum)
Incision and drainage performed. Packing placed.  Bandage applied Return in 48 hours to have packing removed or if you are comfortable, you may remove packing at home.   After packing is removed then you may wash with warm water and mild soap and apply warm compresses to encourage continued drainage. Doxycycline prescribed.   Take antibiotic as prescribed and to completion Follow up here or with PCP if symptoms persists Return or go to the ED if you have any new or worsening symptoms increased pain, redness, swelling, fever, chills, nausea, vomiting, etc..Marland Kitchen

## 2018-10-05 NOTE — ED Triage Notes (Addendum)
Pt has an abscess to his right temple that started developing a little less than a week ago.  He reports some throbbing in his right eye since it has gotten bigger.  He denies any fever and no drainage.

## 2018-12-12 ENCOUNTER — Encounter (HOSPITAL_COMMUNITY): Payer: Self-pay | Admitting: *Deleted

## 2018-12-12 ENCOUNTER — Emergency Department (HOSPITAL_COMMUNITY)
Admission: EM | Admit: 2018-12-12 | Discharge: 2018-12-12 | Disposition: A | Discharge status: Home / Self Care | Payer: Medicaid Other | Attending: Emergency Medicine | Admitting: Emergency Medicine

## 2018-12-12 ENCOUNTER — Emergency Department (HOSPITAL_COMMUNITY): Payer: Medicaid Other

## 2018-12-12 DIAGNOSIS — N189 Chronic kidney disease, unspecified: Secondary | ICD-10-CM | POA: Insufficient documentation

## 2018-12-12 DIAGNOSIS — F17210 Nicotine dependence, cigarettes, uncomplicated: Secondary | ICD-10-CM | POA: Insufficient documentation

## 2018-12-12 DIAGNOSIS — Y990 Civilian activity done for income or pay: Secondary | ICD-10-CM | POA: Diagnosis not present

## 2018-12-12 DIAGNOSIS — T148XXA Other injury of unspecified body region, initial encounter: Secondary | ICD-10-CM

## 2018-12-12 DIAGNOSIS — S299XXA Unspecified injury of thorax, initial encounter: Secondary | ICD-10-CM | POA: Diagnosis present

## 2018-12-12 DIAGNOSIS — I129 Hypertensive chronic kidney disease with stage 1 through stage 4 chronic kidney disease, or unspecified chronic kidney disease: Secondary | ICD-10-CM | POA: Diagnosis not present

## 2018-12-12 DIAGNOSIS — S29012A Strain of muscle and tendon of back wall of thorax, initial encounter: Secondary | ICD-10-CM | POA: Insufficient documentation

## 2018-12-12 DIAGNOSIS — Y9389 Activity, other specified: Secondary | ICD-10-CM | POA: Diagnosis not present

## 2018-12-12 DIAGNOSIS — Z79899 Other long term (current) drug therapy: Secondary | ICD-10-CM | POA: Insufficient documentation

## 2018-12-12 DIAGNOSIS — X500XXA Overexertion from strenuous movement or load, initial encounter: Secondary | ICD-10-CM | POA: Insufficient documentation

## 2018-12-12 DIAGNOSIS — Y9289 Other specified places as the place of occurrence of the external cause: Secondary | ICD-10-CM | POA: Insufficient documentation

## 2018-12-12 LAB — HEPATIC FUNCTION PANEL
ALT: 35 U/L (ref 0–44)
AST: 39 U/L (ref 15–41)
Albumin: 3.8 g/dL (ref 3.5–5.0)
Alkaline Phosphatase: 76 U/L (ref 38–126)
Bilirubin, Direct: 0.1 mg/dL (ref 0.0–0.2)
Indirect Bilirubin: 0.6 mg/dL (ref 0.3–0.9)
Total Bilirubin: 0.7 mg/dL (ref 0.3–1.2)
Total Protein: 8 g/dL (ref 6.5–8.1)

## 2018-12-12 LAB — BASIC METABOLIC PANEL
Anion gap: 11 (ref 5–15)
BUN: 15 mg/dL (ref 6–20)
CALCIUM: 9.1 mg/dL (ref 8.9–10.3)
CO2: 26 mmol/L (ref 22–32)
Chloride: 104 mmol/L (ref 98–111)
Creatinine, Ser: 1.44 mg/dL — ABNORMAL HIGH (ref 0.61–1.24)
GFR, EST NON AFRICAN AMERICAN: 58 mL/min — AB (ref >60)
Glucose, Bld: 98 mg/dL (ref 70–99)
POTASSIUM: 3.3 mmol/L — AB (ref 3.5–5.1)
Sodium: 141 mmol/L (ref 135–145)

## 2018-12-12 LAB — CBC
HCT: 46.1 % (ref 39.0–52.0)
HEMOGLOBIN: 14.6 g/dL (ref 13.0–17.0)
MCH: 29.2 pg (ref 26.0–34.0)
MCHC: 31.7 g/dL (ref 30.0–36.0)
MCV: 92.2 fL (ref 80.0–100.0)
PLATELETS: 208 10*3/uL (ref 150–400)
RBC: 5 MIL/uL (ref 4.22–5.81)
RDW: 14.2 % (ref 11.5–15.5)
WBC: 6 10*3/uL (ref 4.0–10.5)
nRBC: 0 % (ref 0.0–0.2)

## 2018-12-12 LAB — I-STAT TROPONIN, ED
Troponin i, poc: 0.01 ng/mL (ref 0.00–0.08)
Troponin i, poc: 0.01 ng/mL (ref 0.00–0.08)

## 2018-12-12 LAB — RAPID URINE DRUG SCREEN, HOSP PERFORMED
Amphetamines: NOT DETECTED
Barbiturates: NOT DETECTED
Benzodiazepines: NOT DETECTED
Cocaine: POSITIVE — AB
Opiates: NOT DETECTED
Tetrahydrocannabinol: NOT DETECTED

## 2018-12-12 LAB — LIPASE, BLOOD: Lipase: 41 U/L (ref 11–51)

## 2018-12-12 MED ORDER — IOPAMIDOL (ISOVUE-370) INJECTION 76%
INTRAVENOUS | Status: AC
  Filled 2018-12-12: qty 100

## 2018-12-12 MED ORDER — SODIUM CHLORIDE 0.9% FLUSH
3.0000 mL | Freq: Once | INTRAVENOUS | Status: AC
  Administered 2018-12-12: 3 mL via INTRAVENOUS

## 2018-12-12 MED ORDER — ACETAMINOPHEN 500 MG PO TABS
1000.0000 mg | ORAL_TABLET | Freq: Once | ORAL | Status: AC
  Administered 2018-12-12: 1000 mg via ORAL
  Filled 2018-12-12: qty 2

## 2018-12-12 MED ORDER — IOPAMIDOL (ISOVUE-370) INJECTION 76%
100.0000 mL | Freq: Once | INTRAVENOUS | Status: AC | PRN
  Administered 2018-12-12: 54 mL via INTRAVENOUS

## 2018-12-12 NOTE — ED Notes (Signed)
Patient transported to CT 

## 2018-12-12 NOTE — ED Triage Notes (Signed)
Pt in c/o left posterior rib/back pain that occurs with movement or taking a deep breath or coughing, no distress noted

## 2018-12-12 NOTE — ED Triage Notes (Signed)
Pt has history of DVT and PE, reports this feels the same, has not been compliant with anticoagulation, unsure of last dose
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
## 2019-01-05 ENCOUNTER — Ambulatory Visit (INDEPENDENT_AMBULATORY_CARE_PROVIDER_SITE_OTHER): Payer: Medicaid Other

## 2019-01-05 ENCOUNTER — Other Ambulatory Visit: Payer: Self-pay

## 2019-01-05 ENCOUNTER — Ambulatory Visit: Payer: Medicaid Other | Admitting: Podiatry

## 2019-01-05 ENCOUNTER — Encounter: Payer: Self-pay | Admitting: Podiatry

## 2019-01-05 VITALS — BP 125/80 | HR 90 | Ht 71.0 in | Wt 279.0 lb

## 2019-01-05 DIAGNOSIS — M6740 Ganglion, unspecified site: Secondary | ICD-10-CM

## 2019-01-05 DIAGNOSIS — M7989 Other specified soft tissue disorders: Secondary | ICD-10-CM

## 2019-01-05 MED ORDER — METHYLPREDNISOLONE 4 MG PO TBPK: ORAL_TABLET | ORAL | 0 refills | Status: DC

## 2019-01-05 NOTE — Progress Notes (Signed)
Subjective:    Patient ID: Edwin Condon Sr., male    DOB: 1970/12/30, 48 y.o.   MRN: [id number]  HPI 49 year old male presents the office today for concerns of swelling, growth on the left fifth toe joint.  This is been ongoing for last 8 months and gradually getting worse.  Describes pain 4/10 describes a burning, stinging pain.  He has had no recent treatment for this.  He has no other concerns.   Review of Systems  Musculoskeletal: Positive for arthralgias and myalgias.  All other systems reviewed and are negative.  Past Medical History:  Diagnosis Date  . Arthritis    "knees" (08/07/2014)  . Chronic kidney disease    "borderline"  . DVT (deep venous thrombosis) ([employer])   . Fracture of leg    left lower  . History of blood transfusion    "related to blood loss when I had surgery"  . Hypertension    "does not have primary doctor at this time"  . Osteomyelitis of left leg ([facility])   . PE (pulmonary embolism)     Past Surgical History:  Procedure Laterality Date  . APPLICATION OF WOUND VAC  10/17/2012   Procedure: APPLICATION OF WOUND VAC;  Surgeon: Rozanna Box, MD;  Location: [place];  Service: Orthopedics;  Laterality: Left;  re-application of wound vac medial aspect of left lower leg  . EXTERNAL FIXATION REMOVAL Left 01/19/2013   Procedure: REMOVAL EXTERNAL FIXATION LEG;  Surgeon: Rozanna Box, MD;  Location: [place];  Service: Orthopedics;  Laterality: Left;  . HARDWARE REMOVAL Left 08/09/2014   Procedure: HARDWARE REMOVAL LEFT ANKLE;  Surgeon: Rozanna Box, MD;  Location: [place];  Service: Orthopedics;  Laterality: Left;  . HIP SURGERY Bilateral 1983   "used bones from my hips for my kneecaps"  . I&D EXTREMITY  10/14/2012   Procedure: IRRIGATION AND DEBRIDEMENT EXTREMITY;  Surgeon: Rozanna Box, MD;  Location: [place];  Service: Orthopedics;  Laterality: Left;  with vac change   . KNEE HARDWARE REMOVAL     due to infection   . 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  Marland Kitchen ORIF TIBIA FRACTURE  10/11/2012   Procedure: OPEN REDUCTION INTERNAL FIXATION (ORIF) TIBIA FRACTURE;  Surgeon: Rozanna Box, MD;  Location: [place];  Service: Orthopedics;  Laterality: Left;  . PATELLA FRACTURE SURGERY Bilateral 1983  . SKIN SPLIT GRAFT  10/17/2012   Procedure: SKIN GRAFT SPLIT THICKNESS;  Surgeon: Rozanna Box, MD;  Location: [place];  Service: Orthopedics;  Laterality: Left;  Split thickness skin graft left lower leg from Right Thigh     Current Outpatient Medications:  .  Amlodipine-Valsartan-HCTZ (EXFORGE HCT) 10-320-25 MG TABS, Take 1 tablet by mouth daily., Disp: , Rfl:  .  Aspirin-Acetaminophen-Caffeine (GOODY HEADACHE PO), Take 1 packet by mouth as needed (for headaches)., Disp: , Rfl:  .  gabapentin (NEURONTIN) 300 MG capsule, Take 300 mg by mouth 3 (three) times daily., Disp: , Rfl:  .  naproxen sodium (ALEVE) 220 MG tablet, Take 220-440 mg by mouth 2 (two) times daily as needed (for pain or headaches). , Disp: , Rfl:  .  NARCAN 4 MG/0.1ML LIQD nasal spray kit, Place 1 spray into the nose once as needed (  AS DIRECTED). , Disp: , Rfl: 0 .  oxyCODONE (OXY IR/ROXICODONE) 5 MG immediate release tablet, Take 5 mg by mouth 4 (four) times daily as needed (for pain). , Disp: , Rfl:  .  sildenafil (VIAGRA) 100 MG tablet, Take 100 mg by mouth See admin instructions., Disp: , Rfl:  .  methylPREDNISolone (MEDROL DOSEPAK) 4 MG TBPK tablet, Take as directed, Disp: 21 tablet, Rfl: 0  No Known Allergies       Objective:   Physical Exam  General: AAO x3, NAD  Dermatological: Skin is warm, dry and supple bilateral. Nails x 10 are well manicured; remaining integument appears unremarkable at this time. There are no open sores, no  preulcerative lesions, no rash or signs of infection present.  Vascular: Dorsalis Pedis artery and Posterior Tibial artery pedal pulses are 2/4 bilateral with immedate capillary fill time. There is no pain with calf compression, swelling, warmth, erythema.   Neruologic: Grossly intact via light touch bilateral. Vibratory intact via tuning fork bilateral. Protective threshold with Semmes Wienstein monofilament intact to all pedal sites bilateral.   Musculoskeletal: Large soft tissue mass present on the fifth MPJ.  Appears to be fluid-filled upon initial evaluation.  There is no erythema increased warmth.  Not able to elicit any tenderness on exam however there is tenderness most with wearing shoes and pressure.  No other areas of tenderness identified.  Muscular strength 5/5 in all groups tested bilateral.  Gait: Unassisted, Nonantalgic.      Assessment & Plan:  48 year old male left foot soft tissue mass -Treatment options discussed including all alternatives, risks, and complications -Etiology of symptoms were discussed -X-rays were obtained and reviewed with the patient.  No evidence of acute fracture or stress fracture.  No calcifications present.  Soft tissue swelling is present. -Today try to aspirate the cyst.  The area was anesthetized with mixture lidocaine, Marcaine plain.  Once anesthetized I used Betadine to prep the skin and 18-gauge needle was utilized to try to aspirate the cyst but not able to get any fluid.  After 2 attempts no fluid was aspirated.  A mixture of 1 cc Kenalog, 10, 0.5 cc of Marcaine plain was infiltrated into the cyst.  Compression bandage was applied. -Prescribed Medrol Dosepak. -He wishes to hold off on MRI or ultrasound.  We will see if he gets improvement with this.  If not get an ultrasound of the area.  Trula Slade DPM

## 2019-01-19 ENCOUNTER — Ambulatory Visit: Payer: Medicaid Other | Admitting: Podiatry

## 2019-01-19 ENCOUNTER — Encounter: Payer: Self-pay | Admitting: Podiatry

## 2019-01-19 ENCOUNTER — Telehealth: Payer: Self-pay | Admitting: *Deleted

## 2019-01-19 DIAGNOSIS — M7989 Other specified soft tissue disorders: Secondary | ICD-10-CM

## 2019-01-19 DIAGNOSIS — M21622 Bunionette of left foot: Secondary | ICD-10-CM

## 2019-01-19 DIAGNOSIS — M6740 Ganglion, unspecified site: Secondary | ICD-10-CM

## 2019-01-19 NOTE — Telephone Encounter (Signed)
[place] AUTHORIZED [place] [id number], [place] NUMBER:  [id number], EFFECTIVE:  01/19/2019, END:  02/18/2019. Faxed to [facility].

## 2019-01-19 NOTE — Telephone Encounter (Signed)
-----   Message from Trula Slade, DPM sent at 01/19/2019  9:20 AM EST ----- Can you please order a diagnostic ultrasound of the left foot to evaluate the soft tissue mass? Thanks.

## 2019-01-20 NOTE — Progress Notes (Signed)
Subjective: 48 year old male presents the office today for follow-up evaluation of swelling, cyst on the left   fifth metatarsal head area.  He states that it is still present.  May have gone down some but overall still present causing discomfort with wearing shoes.  He was discussed further treatment options at this time.  No recent injury or changes does have a saw him. Denies any systemic complaints such as fevers, chills, nausea, vomiting. No acute changes since last appointment, and no other complaints at this time.   Objective: AAO x3, NAD DP/PT pulses palpable bilaterally, CRT less than 3 seconds There is localized tenderness area present to the lateral aspect of his metatarsal head appears to be soft tissue mass.  Appears to be almost a bursitis.  There is no erythema or increase in warmth.  No open lesions or pre-ulcerative lesions.  There is mild bony prominence present on palpation of the lateral fifth metatarsal head. No pain with calf compression, swelling, warmth, erythema  Assessment: Soft tissue mass, likely bursitis/cyst left foot; mild tailor's bunion  Plan: -All treatment options discussed with the patient including all alternatives, risks, complications.  -We discussed multiple treatment options with conservative as well as surgical.  He wants to consider surgical intervention.  Prior to this I do want to order an ultrasound which was done today to further evaluate the area before considering surgery.  He declined further steroid injection at this time. -Patient encouraged to call the office with any questions, concerns, change in symptoms.   Trula Slade DPM

## 2019-01-23 ENCOUNTER — Other Ambulatory Visit: Payer: Self-pay | Admitting: Podiatry

## 2019-01-23 DIAGNOSIS — M7989 Other specified soft tissue disorders: Secondary | ICD-10-CM

## 2019-01-23 DIAGNOSIS — M6740 Ganglion, unspecified site: Secondary | ICD-10-CM

## 2019-01-25 ENCOUNTER — Telehealth: Payer: Self-pay | Admitting: *Deleted

## 2019-01-25 NOTE — Telephone Encounter (Signed)
Pt states he has not received a call concerning his [place] appt.

## 2019-01-25 NOTE — Telephone Encounter (Signed)
I called pt and informed his orders had been sent to [facility] and he could call [phone].

## 2019-01-30 ENCOUNTER — Other Ambulatory Visit: Payer: Medicaid Other

## 2019-01-31 ENCOUNTER — Ambulatory Visit
Admission: RE | Admit: 2019-01-31 | Discharge: 2019-01-31 | Disposition: A | Discharge status: Home / Self Care | Payer: Medicaid Other | Source: Ambulatory Visit | Attending: Podiatry | Admitting: Podiatry

## 2019-01-31 DIAGNOSIS — M6740 Ganglion, unspecified site: Secondary | ICD-10-CM

## 2019-01-31 DIAGNOSIS — R7989 Other specified abnormal findings of blood chemistry: Secondary | ICD-10-CM

## 2019-01-31 DIAGNOSIS — M7989 Other specified soft tissue disorders: Secondary | ICD-10-CM

## 2019-02-01 ENCOUNTER — Telehealth: Payer: Self-pay | Admitting: *Deleted

## 2019-02-01 NOTE — Telephone Encounter (Signed)
-----   Message from Trula Slade, DPM sent at 02/01/2019 12:29 PM EDT ----- Please let him know that the MRI did show a solid mass but nonspecific. I would recommend excision of the mass. Please have him come in to be seen.

## 2019-02-01 NOTE — Telephone Encounter (Signed)
I informed pt of Dr. Leigh Aurora review of results and orders, and that I would have a scheduler contact him tomorrow for an appt. Pt states understanding.

## 2019-02-03 ENCOUNTER — Telehealth: Payer: Self-pay | Admitting: *Deleted

## 2019-02-03 NOTE — Telephone Encounter (Signed)
"  I am calling back about an appointment for my surgery.  Please call me back."

## 2019-02-07 ENCOUNTER — Other Ambulatory Visit: Payer: Self-pay

## 2019-02-07 ENCOUNTER — Ambulatory Visit (INDEPENDENT_AMBULATORY_CARE_PROVIDER_SITE_OTHER): Payer: Medicaid Other | Admitting: Podiatry

## 2019-02-07 DIAGNOSIS — M7989 Other specified soft tissue disorders: Secondary | ICD-10-CM

## 2019-02-07 DIAGNOSIS — M21622 Bunionette of left foot: Secondary | ICD-10-CM | POA: Diagnosis not present

## 2019-02-07 NOTE — Patient Instructions (Signed)

## 2019-02-07 NOTE — Telephone Encounter (Signed)
Edwin Rodriguez came in today to and saw Dr. Jacqualyn Posey.

## 2019-02-13 NOTE — Progress Notes (Signed)
Subjective: 48 year old male presents the office today for evaluation of soft tissue mass on the left foot.  He states that his pain is somewhat worse and feels a stabbing pain on the area.  He wants to proceed with surgery at this time. Denies any systemic complaints such as fevers, chills, nausea, vomiting. No acute changes since last appointment, and no other complaints at this time.   Objective: AAO x3, NAD DP/PT pulses palpable bilaterally, CRT less than 3 seconds Soft tissue mass present along the lateral aspect of the foot on the fifth metatarsal head area.  There is tenderness palpation.  Subjectively describing sharp discomfort.  No other areas of tenderness elicited.  No open lesions or pre-ulcerative lesions.  No pain with calf compression, swelling, warmth, erythema  Assessment: Soft tissue mass, tailor's bunion left foot  PLan: -All treatment options discussed with the patient including all alternatives, risks, complications.  -Reviewed the ultrasound results with him.  We discussed both conservative as well as surgical treatment options.  At this time given the continued pain as well as the mass not improving and continued to be painful he wants to proceed with surgery.  Discussed with him excision of soft tissue mass with exostectomy of the tailor's bunion.  We discussed the surgery as well as postoperative course. -The incision placement as well as the postoperative course was discussed with the patient. I discussed risks of the surgery which include, but not limited to, infection, bleeding, pain, swelling, need for further surgery, delayed or nonhealing, painful or ugly scar, numbness or sensation changes, over/under correction, recurrence, transfer lesions, further deformity, hardware failure, DVT/PE, loss of toe/foot. Patient understands these risks and wishes to proceed with surgery. The surgical consent was reviewed with the patient all 3 pages were signed. No promises or  guarantees were given to the outcome of the procedure. All questions were answered to the best of my ability. Before the surgery the patient was encouraged to call the office if there is any further questions. The surgery will be performed at the [facility] on an outpatient basis. -Upon asking about his cocaine use he does admit that he used cocaine last week and he could not get his pain medicine which is prescribed by [facility].  Due to this we have to hold off on surgery for now and his drug test needs to be repeated and he needs to follow with his pain management clinic.  Discussed with him that cocaine can increase the chance of heart attack, death perioperatively. -Patient encouraged to call the office with any questions, concerns, change in symptoms.   Trula Slade DPM

## 2019-02-14 DIAGNOSIS — R7989 Other specified abnormal findings of blood chemistry: Secondary | ICD-10-CM | POA: Diagnosis not present

## 2019-02-15 ENCOUNTER — Telehealth: Payer: Self-pay | Admitting: *Deleted

## 2019-02-15 NOTE — Telephone Encounter (Signed)
Yes, we talked about this. He had a positive drug screen and they would not prescribe it. I cannot prescribe pain medication at this time for him.

## 2019-02-15 NOTE — Telephone Encounter (Signed)
"  I'm a patient of Dr. Jacqualyn Posey.  I'm scheduled to have surgery on April 8.  I am calling to see if Dr. Jacqualyn Posey can prescribe me some pain medicine until then."  Dr. Jacqualyn Posey cannot prescribe you any pain medication.  You need to contact your pain management doctor, Dr. Darlin Coco at [facility].  "Okay, alright."

## 2019-02-22 ENCOUNTER — Telehealth: Payer: Self-pay | Admitting: *Deleted

## 2019-02-22 NOTE — Telephone Encounter (Signed)
I attempted to call Edwin Rodriguez.  I left him a message that I need to reschedule his appointment from March 01, 2019 to possibly 04/19/2019.

## 2019-05-08 ENCOUNTER — Telehealth: Payer: Self-pay | Admitting: *Deleted

## 2019-05-08 NOTE — Telephone Encounter (Signed)
"  I was scheduled to have surgery back in May.  I want to reschedule it."  You are required to have a drug test prior to having surgery.  Have you had a test done recently?  "I have not but I'm clean right now.  Can I come by there to have the test?"  We don't do drug tests here.  When was the last time that you saw Dr. Colan Rodriguez?  "I'm scheduled to see him on July 1."  We can schedule you after July 1.  "I'm clean right now but I don't know if I will be by that time."  Dr. Jacqualyn Rodriguez cannot perform your surgery without get a negative drug test.  He can do it on June 07, 2019.  "So what you are saying is that I need to wait on doing my surgery?"  Yes, we must receive the results for the drug test prior to you having the surgery. "Okay, I am about 50% clean right now but I don't know how I'll be by then.  I'll call back."

## 2019-05-25 ENCOUNTER — Telehealth: Payer: Self-pay | Admitting: *Deleted

## 2019-05-25 NOTE — Telephone Encounter (Signed)
"  I called last week about having surgery on my foot.  You said Dr. Jacqualyn Posey had something available on the 15th of June.  Please give me a call back."  "I'm calling back, I'm trying to get my operation on my foot for the 15th of July.  Please give me a call back."

## 2019-05-29 NOTE — Telephone Encounter (Signed)
I'm returning your call.  Dr. Jacqualyn Posey does not have anything available on July 15.  "When can he do it?"  He can probably do it on June 14, 2019.  "That will work."  You know what you have to do prior to that date, correct?  "No, what I got to do?"  You must take a drug test about 3-4 days before your surgery date.  "Okay, it was clean when I had it checked at pain management.  When do I need to do it, that Monday before?"  You probably need to do it the Friday before.  I'll check with Dr. Jacqualyn Posey and let you know for sure.  "Thanks for calling me back."

## 2019-05-29 NOTE — Telephone Encounter (Signed)
"  I called you last week trying to get in with Dr. Jacqualyn Posey for the fifteenth of this month.  Give me a call back."

## 2019-06-01 ENCOUNTER — Encounter: Payer: Self-pay | Admitting: *Deleted

## 2019-06-01 ENCOUNTER — Other Ambulatory Visit: Payer: Self-pay | Admitting: *Deleted

## 2019-06-01 DIAGNOSIS — Z01818 Encounter for other preprocedural examination: Secondary | ICD-10-CM

## 2019-06-01 NOTE — Progress Notes (Signed)
Per Dr. Jacqualyn Posey, I placed an order for a urine drug screening.

## 2019-06-01 NOTE — Progress Notes (Signed)
Per Dr. Jacqualyn Posey, I sent medical clearance request letter to Dr. Melina Fiddler.

## 2019-06-05 NOTE — Telephone Encounter (Signed)
Yes please have him get a drug screen before. Probably can be done Monday morning. We may need to call the lab to see how long it takes to get the results back.

## 2019-06-08 NOTE — Telephone Encounter (Signed)
"  I'm supposed to have surgery next Wednesday on the 22nd.  I'm ready to come and take the drug test but I don't know where to go to or what to do.  Please give me a call back."  I left Edwin Rodriguez a message to come by the office to get his requisition form.  I attached the name of two labs, [facility] as well as their addresses onto the requisition form and left it at the front desk.  I informed him that the test must be done on tomorrow.

## 2019-06-09 ENCOUNTER — Other Ambulatory Visit: Payer: Self-pay | Admitting: Podiatry

## 2019-06-12 LAB — 726778 7+ALC-UNBUND
Amphetamines, Urine: NEGATIVE ng/mL
Barbiturate Quant, Ur: NEGATIVE ng/mL
Benzodiazepine Quant, Ur: NEGATIVE ng/mL
Cannabinoid Quant, Ur: NEGATIVE ng/mL
Cocaine (Metab.): NEGATIVE ng/mL
Ethanol, Urine: NEGATIVE %
Opiate Quant, Ur: NEGATIVE ng/mL
PCP Quant, Ur: NEGATIVE ng/mL

## 2019-06-14 ENCOUNTER — Encounter: Payer: Self-pay | Admitting: Podiatry

## 2019-06-14 ENCOUNTER — Other Ambulatory Visit: Payer: Self-pay | Admitting: Podiatry

## 2019-06-14 ENCOUNTER — Telehealth: Payer: Self-pay

## 2019-06-14 DIAGNOSIS — D492 Neoplasm of unspecified behavior of bone, soft tissue, and skin: Secondary | ICD-10-CM

## 2019-06-14 DIAGNOSIS — M2012 Hallux valgus (acquired), left foot: Secondary | ICD-10-CM

## 2019-06-14 MED ORDER — PROMETHAZINE HCL 25 MG PO TABS: 25.0000 mg | ORAL_TABLET | Freq: Three times a day (TID) | ORAL | 0 refills | Status: DC | PRN

## 2019-06-14 MED ORDER — CEPHALEXIN 500 MG PO CAPS: 500.0000 mg | ORAL_CAPSULE | Freq: Three times a day (TID) | ORAL | 0 refills | Status: DC

## 2019-06-14 MED ORDER — HYDROCODONE-ACETAMINOPHEN 5-325 MG PO TABS: 1.0000 | ORAL_TABLET | Freq: Four times a day (QID) | ORAL | 0 refills | Status: DC | PRN

## 2019-06-14 MED ORDER — IBUPROFEN 800 MG PO TABS: 800.0000 mg | ORAL_TABLET | Freq: Three times a day (TID) | ORAL | 0 refills | Status: DC | PRN

## 2019-06-14 NOTE — Progress Notes (Signed)
Called pharmacy to cancel ibuprofen given creatine. Message left

## 2019-06-14 NOTE — Telephone Encounter (Signed)
Spoke with [facility] at [phone].  The doctor there stated that if the patient has been off of his Suboxone for a week, he could be prescribed traditional pain medication such as Vicodin.  But if the patient was continuing to take his Suboxone, he would need to follow-up with the clinic there for pain management.

## 2019-06-14 NOTE — Progress Notes (Signed)
Edwin Rodriguez spoke to the pain management center. They are aware we are going to do pain medication. Verified with the patient he has been off of subaxone for a week now. He has not reported this to his pain specialist however we informed them.

## 2019-06-15 ENCOUNTER — Telehealth: Payer: Self-pay | Admitting: Podiatry

## 2019-06-15 NOTE — Telephone Encounter (Signed)
Pt had surgery yesterday 06/14/19 and requested that the doctor get him set up for home health and is calling to follow up and see if the process has been started.

## 2019-06-20 ENCOUNTER — Ambulatory Visit (INDEPENDENT_AMBULATORY_CARE_PROVIDER_SITE_OTHER): Payer: Medicaid Other

## 2019-06-20 ENCOUNTER — Other Ambulatory Visit: Payer: Self-pay

## 2019-06-20 ENCOUNTER — Telehealth: Payer: Self-pay | Admitting: *Deleted

## 2019-06-20 ENCOUNTER — Ambulatory Visit (INDEPENDENT_AMBULATORY_CARE_PROVIDER_SITE_OTHER): Payer: Medicaid Other | Admitting: Podiatry

## 2019-06-20 ENCOUNTER — Encounter: Payer: Self-pay | Admitting: Podiatry

## 2019-06-20 VITALS — Temp 97.7°F

## 2019-06-20 DIAGNOSIS — M21622 Bunionette of left foot: Secondary | ICD-10-CM | POA: Diagnosis not present

## 2019-06-20 DIAGNOSIS — M7989 Other specified soft tissue disorders: Secondary | ICD-10-CM

## 2019-06-20 DIAGNOSIS — Z09 Encounter for follow-up examination after completed treatment for conditions other than malignant neoplasm: Secondary | ICD-10-CM

## 2019-06-20 MED ORDER — HYDROCODONE-ACETAMINOPHEN 5-325 MG PO TABS: 1.0000 | ORAL_TABLET | Freq: Four times a day (QID) | ORAL | 0 refills | Status: DC | PRN

## 2019-06-20 NOTE — Telephone Encounter (Signed)
[facility] - Edwin Rodriguez pt is on Suboxone last refilled 05/24/2019 and 05/31/2019 and was told by the Suboxone prescribing doctor to stop the percocet and Dr. Jacqualyn Posey prescribed hydrocodone.

## 2019-06-20 NOTE — Telephone Encounter (Signed)
Called and left a message for the patient to call me back at [phone]. Edwin Rodriguez

## 2019-06-20 NOTE — Telephone Encounter (Signed)
Dr. Jacqualyn Posey states pt has stopped the Suboxone and he had prescribed the hydrocodone previously. I informed Marya Amsler - [facility] of Dr. Leigh Aurora statement.

## 2019-06-20 NOTE — Telephone Encounter (Signed)
-----   Message from Trula Slade, DPM sent at 06/19/2019  5:09 PM EDT ----- He was suppose to let me know who he wants to use. He said he had someone already.  ----- Message ----- From: Viviana Simpler, PMAC Sent: 06/16/2019   3:40 PM EDT To: Trula Slade, DPM  Hi Dr Jacqualyn Posey.patient would like home health and had surgery yesterday. Lattie Haw

## 2019-06-21 ENCOUNTER — Telehealth: Payer: Self-pay | Admitting: *Deleted

## 2019-06-21 NOTE — Telephone Encounter (Signed)
[facility] asked if pt received a narcotic from our office, that he receives Suboxone from their office. I told Daphne, Dr. Jacqualyn Posey had said pt had stopped the Suboxone about 2 weeks ago, and he would only receive pain medication from our office during the surgery recovery period and pt will need to continue the pain management with [facility] after.

## 2019-06-21 NOTE — Telephone Encounter (Signed)
Yes, and Ernestine Conrad called and spoke to their office last week to let them know and they gave the approval for [place] to prescribe pain medication as he had taken himself off of the subaxone a week before surgery. He never informed the pain clinic of this apparently. I did refill yesterday and I told him when he went to the appointment today he needed to make sure that he informed them.

## 2019-06-26 ENCOUNTER — Telehealth: Payer: Self-pay | Admitting: *Deleted

## 2019-06-26 NOTE — Telephone Encounter (Signed)
Pt called to check status of the [facility] aide. Completed required form for short-term 3 months and faxed with demographics to [employer].

## 2019-06-27 ENCOUNTER — Other Ambulatory Visit: Payer: Self-pay

## 2019-06-27 ENCOUNTER — Ambulatory Visit (INDEPENDENT_AMBULATORY_CARE_PROVIDER_SITE_OTHER): Payer: Medicaid Other | Admitting: Podiatry

## 2019-06-27 ENCOUNTER — Encounter: Payer: Self-pay | Admitting: Podiatry

## 2019-06-27 VITALS — Temp 97.3°F

## 2019-06-27 DIAGNOSIS — Z09 Encounter for follow-up examination after completed treatment for conditions other than malignant neoplasm: Secondary | ICD-10-CM

## 2019-06-27 DIAGNOSIS — M7989 Other specified soft tissue disorders: Secondary | ICD-10-CM

## 2019-06-27 DIAGNOSIS — M21622 Bunionette of left foot: Secondary | ICD-10-CM

## 2019-06-28 ENCOUNTER — Telehealth: Payer: Self-pay | Admitting: *Deleted

## 2019-06-28 NOTE — Progress Notes (Signed)
Subjective: Edwin Casanova Sr. is a 48 y.o. is seen today in office s/p left foot soft tissue mass excision, exostectomy metatarsal head preformed on 06/14/2019.  Overall he is doing better.  Still gets some throbbing discomfort but his pain is improving.  He is out of the Vicodin.  His pain management specialist did put him back on Suboxone. Denies any systemic complaints such as fevers, chills, nausea, vomiting. No calf pain, chest pain, shortness of breath.   Objective: General: No acute distress, AAOx3  DP/PT pulses palpable 2/4, CRT < 3 sec to all digits.  Protective sensation intact. Motor function intact.  LEFT foot: Incision is well coapted without any evidence of dehiscence except for the central aspect with a small amount of macerated tissue and slight superficial opening of the incision.  There is no drainage of pus and there is no surrounding erythema, ascending cellulitis.  No fluctuation capitation any malodor.  Swelling is improved.  No significant discomfort upon palpation today.  The bandage was very dirty today. No other areas of tenderness to bilateral lower extremities.  No other open lesions or pre-ulcerative lesions.  No pain with calf compression, swelling, warmth, erythema.   Assessment and Plan:  Status post left foot surgery, doing well with no complications   -Treatment options discussed including all alternatives, risks, and complications -Clean the foot today.  Steri-Strips applied for reinforcement Followed by Betadine painted over the incision followed by a DSD.  Out of the dressing change every other day.  We had previously completed paperwork for an aide to come to the house.  Will order dressing changes. -Continue with cam boot -Elevation -Cannot prescribe any further pain medicine given he is on Suboxone.  He understands this  Return in 1 week (on 07/04/2019) for [facility] patient.  Trula Slade DPM

## 2019-06-28 NOTE — Telephone Encounter (Addendum)
Dr. Jacqualyn Posey ordered betadine to left foot 3x week cover with dry sterile dressing. Faxed required form, orders for betadine to left surgery site cover with a dry sterile dressing 3 times a week and demographics to [facility].

## 2019-06-28 NOTE — Progress Notes (Signed)
Subjective: Edwin Medlen Sr. is a 48 y.o. is seen today in office s/p left foot stiffness excision, exostectomy metatarsal head preformed on 06/14/2019.  He describes a throbbing sensation without pain medication.  We have discussed this with his pain management last week.  Denies any systemic complaints such as fevers, chills, nausea, vomiting. No calf pain, chest pain, shortness of breath.   Objective: General: No acute distress, AAOx3  DP/PT pulses palpable 2/4, CRT < 3 sec to all digits.  Protective sensation intact. Motor function intact.  LEFT foot: Incision is well coapted without any evidence of dehiscence.  Sutures are intact there is no surrounding erythema, ascending cellulitis, fluctuance, crepitus, malodor, drainage/purulence. There is mild edema around the surgical site. There is mild pain along the surgical site.  No signs of infection. No other areas of tenderness to bilateral lower extremities.  No other open lesions or pre-ulcerative lesions.  No pain with calf compression, swelling, warmth, erythema.   Assessment and Plan:  Status post left foot surgery, doing well with no complications   -Treatment options discussed including all alternatives, risks, and complications -X-rays were obtained and reviewed.  Status post exostectomy fifth metatarsal head laterally.  Subacute fracture. -Antibiotic ointment and a bandage was applied to the incision followed by dry sterile dressing.  Keep dressing clean, dry, intact -Ice/elevation -Pain medication as needed- refilled today.  We had discussed with his pain management physician mostly during the appointment tomorrow I discussed with him he needs to let them know I refilled his medication today.  He verbalized understanding.  He has not been taking Suboxone now. -Reviewed pathology with him.  -Monitor for any clinical signs or symptoms of infection and DVT/PE and directed to call the office immediately should any occur or go to  the ER. -Follow-up in 1 week or sooner if any problems arise. In the meantime, encouraged to call the office with any questions, concerns, change in symptoms.   Celesta Gentile, DPM

## 2019-06-29 ENCOUNTER — Telehealth: Payer: Self-pay | Admitting: Podiatry

## 2019-06-29 NOTE — Telephone Encounter (Signed)
Pt is waiting on medical supplies, he was in the office on Wednesday. He states he needs to change his bandages every two days. Please call patient

## 2019-06-29 NOTE — Telephone Encounter (Signed)
Pt is supposed to be getting more supplies to re-banadage his foot and he has not received them from the supply store. Pt is calling to follow up and see when the supplies should be expected and what store they are coming from

## 2019-06-30 ENCOUNTER — Encounter: Payer: Medicaid Other | Admitting: Podiatry

## 2019-06-30 NOTE — Telephone Encounter (Signed)
Please advise. Thanks Royalty Fakhouri 

## 2019-06-30 NOTE — Telephone Encounter (Signed)
Per Dr Jacqualyn Posey can order supplies from [facility] and called patient to let him know that the supplies were ordered. Edwin Rodriguez

## 2019-06-30 NOTE — Telephone Encounter (Signed)
Pt is calling to follow up previous request from yesterday in regards to his supplies.

## 2019-07-04 ENCOUNTER — Other Ambulatory Visit: Payer: Medicaid Other

## 2019-07-06 ENCOUNTER — Ambulatory Visit (INDEPENDENT_AMBULATORY_CARE_PROVIDER_SITE_OTHER): Payer: Medicaid Other | Admitting: Podiatry

## 2019-07-06 ENCOUNTER — Other Ambulatory Visit: Payer: Self-pay

## 2019-07-06 DIAGNOSIS — Z09 Encounter for follow-up examination after completed treatment for conditions other than malignant neoplasm: Secondary | ICD-10-CM

## 2019-07-06 DIAGNOSIS — M7989 Other specified soft tissue disorders: Secondary | ICD-10-CM

## 2019-07-06 DIAGNOSIS — M21622 Bunionette of left foot: Secondary | ICD-10-CM

## 2019-07-06 MED ORDER — CEPHALEXIN 500 MG PO CAPS: 500.0000 mg | ORAL_CAPSULE | Freq: Three times a day (TID) | ORAL | 0 refills | Status: DC

## 2019-07-13 ENCOUNTER — Encounter: Payer: Medicaid Other | Admitting: Podiatry

## 2019-07-16 NOTE — Progress Notes (Signed)
Subjective: Edwin Plotnick Sr. is a 48 y.o. is seen today in office s/p left foot soft tissue mass excision, exostectomy metatarsal head preformed on 06/14/2019.  He presents here for possible suture removal.  He states that his pain is improved.  He denies a surgical boot is asking on the shoe today.  Denies any fevers, chills nausea, vomiting.  No calf pain, chest pain, shortness of breath.    Objective: General: No acute distress, AAOx3  DP/PT pulses palpable 2/4, CRT < 3 sec to all digits.  Protective sensation intact. Motor function intact.  LEFT foot: Incision is well coapted without any evidence of dehiscence except for the central aspect with a small amount of macerated tissue and slight superficial opening of the incision.  This has continued there is a small moderate clear drainage but there is no purulence.  There is decreased edema there is no erythema or warmth there is no fluctuation crepitation.  No ascending cellulitis.  No malodor.  No other areas of tenderness to bilateral lower extremities.  No other open lesions or pre-ulcerative lesions.  No pain with calf compression, swelling, warmth, erythema.   Assessment and Plan:  Status post left foot surgery, doing well with no complications   -Treatment options discussed including all alternatives, risks, and complications -I removed some of the sutures today.  Let the central intact.  Clean the wound today.  Recommend a small amount of Betadine to the wound daily.  Prescribed Keflex and start on a clear drainage.  No purulence but continue to monitor closely.  We will placed into a surgical shoe.  His feet do sweat a lot in the boot.  Hopefully this will be helpful as well. -Monitor for any clinical signs or symptoms of infection and directed to call the office immediately should any occur or go to the ER.  Trula Slade DPM

## 2019-07-18 ENCOUNTER — Ambulatory Visit (INDEPENDENT_AMBULATORY_CARE_PROVIDER_SITE_OTHER): Payer: Medicaid Other

## 2019-07-18 ENCOUNTER — Other Ambulatory Visit: Payer: Self-pay | Admitting: Podiatry

## 2019-07-18 ENCOUNTER — Other Ambulatory Visit: Payer: Self-pay

## 2019-07-18 ENCOUNTER — Ambulatory Visit (INDEPENDENT_AMBULATORY_CARE_PROVIDER_SITE_OTHER): Payer: Self-pay | Admitting: Podiatry

## 2019-07-18 VITALS — Temp 97.8°F

## 2019-07-18 DIAGNOSIS — M79672 Pain in left foot: Secondary | ICD-10-CM

## 2019-07-18 DIAGNOSIS — M21622 Bunionette of left foot: Secondary | ICD-10-CM | POA: Diagnosis not present

## 2019-07-18 DIAGNOSIS — M7989 Other specified soft tissue disorders: Secondary | ICD-10-CM

## 2019-07-18 DIAGNOSIS — R609 Edema, unspecified: Secondary | ICD-10-CM

## 2019-07-18 DIAGNOSIS — M898X9 Other specified disorders of bone, unspecified site: Secondary | ICD-10-CM

## 2019-07-24 MED ORDER — CEPHALEXIN 500 MG PO CAPS: 500.0000 mg | ORAL_CAPSULE | Freq: Three times a day (TID) | ORAL | 0 refills | Status: DC

## 2019-07-24 NOTE — Progress Notes (Signed)
Subjective: Edwin Rodriguez. is a 48 y.o. is seen today in office s/p left foot soft tissue mass excision, exostectomy metatarsal head preformed on 06/14/2019.  He feels that overall he is doing better.  He is getting back to normal not had any significant discomfort.  He does have an aide who changes the bandage daily.  Denies any drainage of pus and the swelling has improved and no redness.  He is ready get back to regular shoe.  He denies any fevers, chills, nausea, vomiting.  No calf pain, chest pain, shortness of breath.    Objective: General: No acute distress, AAOx3  DP/PT pulses palpable 2/4, CRT < 3 sec to all digits.  Protective sensation intact. Motor function intact.  LEFT foot: Incision is well coapted without any evidence of dehiscence except the central aspect there is a superficial opening there is no probing to bone, undermining or tunneling.  There is decreased edema there is no erythema and there is no warmth.  There is no fluctuation rotation.  There is no malodor.  No significant tenderness palpation.  No other open lesions or pre-ulcerative lesions.  No pain with calf compression, swelling, warmth, erythema.   Assessment and Plan:  Status post left foot surgery  -Treatment options discussed including all alternatives, risks, and complications -X-rays were obtained reviewed.  There is some distraction on the lateral aspect of metatarsal head which could be a result of remodeling given the recent surgery or osteomyelitis. -Remove the remainder of the sutures today.  Some dry skin was present which I debrided.  Clinically does not appear to be infected.  Continue antibiotic ointment dressing changes. -I will do blood work today including CBC, sed rate.  Also given his pathology result ordered rheumatoid factor, ANA and also uric acid level. -Continue with surgical shoe.  Elevation. -Monitor for any clinical signs or symptoms of infection and directed to call the office  immediately should any occur or go to the ER.  Return in about 1 week (around 07/25/2019).  Repeat x-rays  Trula Slade DPM

## 2019-07-25 ENCOUNTER — Other Ambulatory Visit: Payer: Self-pay

## 2019-07-25 ENCOUNTER — Ambulatory Visit (INDEPENDENT_AMBULATORY_CARE_PROVIDER_SITE_OTHER): Payer: Self-pay | Admitting: Podiatry

## 2019-07-25 ENCOUNTER — Ambulatory Visit (INDEPENDENT_AMBULATORY_CARE_PROVIDER_SITE_OTHER): Payer: Medicaid Other

## 2019-07-25 VITALS — Temp 97.0°F

## 2019-07-25 DIAGNOSIS — M21622 Bunionette of left foot: Secondary | ICD-10-CM

## 2019-07-25 DIAGNOSIS — Z09 Encounter for follow-up examination after completed treatment for conditions other than malignant neoplasm: Secondary | ICD-10-CM

## 2019-07-25 MED ORDER — CEPHALEXIN 500 MG PO CAPS: 500.0000 mg | ORAL_CAPSULE | Freq: Three times a day (TID) | ORAL | 0 refills | Status: DC

## 2019-07-29 LAB — WOUND CULTURE
MICRO NUMBER:: 835675
SPECIMEN QUALITY:: ADEQUATE

## 2019-08-01 ENCOUNTER — Other Ambulatory Visit: Payer: Self-pay | Admitting: Podiatry

## 2019-08-01 ENCOUNTER — Telehealth: Payer: Self-pay | Admitting: *Deleted

## 2019-08-01 MED ORDER — CLINDAMYCIN HCL 300 MG PO CAPS: 300.0000 mg | ORAL_CAPSULE | Freq: Three times a day (TID) | ORAL | 0 refills | Status: DC

## 2019-08-01 MED ORDER — CIPROFLOXACIN HCL 500 MG PO TABS: 500.0000 mg | ORAL_TABLET | Freq: Two times a day (BID) | ORAL | 0 refills | Status: DC

## 2019-08-01 NOTE — Progress Notes (Signed)
Subjective: Edwin Zucca Sr. is a 48 y.o. is seen today in office s/p left foot soft tissue mass excision, exostectomy metatarsal head preformed on 06/14/2019.  He states that overall he is feeling much better and is back to my regular shoe when he states the foot feels normal.  He states he has been showering has been getting his foot wet.  He does not keep a bandage on his foot.  The foot is doing he does not think he needs a bandage.  He states he forgot to get the blood work done that I ordered last appointment.  He denies any fevers, chills, nausea, vomiting.  No calf pain, chest pain, shortness of breath.  Objective: General: No acute distress, AAOx3  DP/PT pulses palpable 2/4, CRT < 3 sec to all digits.  Protective sensation intact. Motor function intact.  LEFT foot: On the central aspect incision there is still an opening which does probe today about a half a centimeter.  There is no probing to bone and there is no drainage or pus identified today.  There is mild swelling to the area there is no erythema or warmth.  No fluctuation crepitation. No other open lesions or pre-ulcerative lesions.  No pain with calf compression, swelling, warmth, erythema.   Assessment and Plan:  Status post left foot surgery  -Treatment options discussed including all alternatives, risks, and complications -X-rays were obtained reviewed.  There is some distraction on the lateral aspect of metatarsal head which could be a result of remodeling given the recent surgery or osteomyelitis however unchanged compared to last x-ray.  Discussed with him still concern for bone infection.  The wound culture was obtained today.  Continue antibiotics.  Encouraged to get the blood work done.  Recommended antibiotic ointment dressing changes daily.  Keep a bandage on the foot at all times.  Return to surgical shoe.  Elevation.  Limit activity.  Discussed with him fifth metatarsal head excision.  Return in about 1 week  (around 08/01/2019).  Trula Slade DPM

## 2019-08-01 NOTE — Telephone Encounter (Signed)
I called Edwin Rodriguez and informed of Dr. Leigh Aurora orders and to have the blood work performed. Edwin Rodriguez states he will have the blood work performed today, and the foot does not have any signs of infection.

## 2019-08-01 NOTE — Telephone Encounter (Signed)
Left message for pt to call to discuss lab results and new orders for his medications.

## 2019-08-01 NOTE — Telephone Encounter (Signed)
-----   Message from Trula Slade, DPM sent at 08/01/2019  7:29 AM EDT ----- Val- please let him know to stop the keflex and start the clinda and cipro I sent to his pharmacy. Also, please have him get the blood work that I ordered 2 weeks ago. He didn't get it done as he said he "forgot".  Also, can you see how he is doing? Make sure there is no redness, drainage, fevers, chills or other signs of infection. Thanks.

## 2019-08-03 LAB — CBC WITH DIFFERENTIAL/PLATELET
Absolute Monocytes: 515 cells/uL (ref 200–950)
Basophils Absolute: 18 cells/uL (ref 0–200)
Basophils Relative: 0.4 %
Eosinophils Absolute: 60 cells/uL (ref 15–500)
Eosinophils Relative: 1.3 %
HCT: 43.2 % (ref 38.5–50.0)
Hemoglobin: 14.6 g/dL (ref 13.2–17.1)
Lymphs Abs: 906 cells/uL (ref 850–3900)
MCH: 30.7 pg (ref 27.0–33.0)
MCHC: 33.8 g/dL (ref 32.0–36.0)
MCV: 90.9 fL (ref 80.0–100.0)
MPV: 11.1 fL (ref 7.5–12.5)
Monocytes Relative: 11.2 %
Neutro Abs: 3100 cells/uL (ref 1500–7800)
Neutrophils Relative %: 67.4 %
Platelets: 255 10*3/uL (ref 140–400)
RBC: 4.75 10*6/uL (ref 4.20–5.80)
RDW: 15.9 % — ABNORMAL HIGH (ref 11.0–15.0)
Total Lymphocyte: 19.7 %
WBC: 4.6 10*3/uL (ref 3.8–10.8)

## 2019-08-03 LAB — URIC ACID: Uric Acid, Serum: 12.8 mg/dL — ABNORMAL HIGH (ref 4.0–8.0)

## 2019-08-03 LAB — RHEUMATOID FACTOR: Rheumatoid fact SerPl-aCnc: 230 IU/mL — ABNORMAL HIGH (ref <14)

## 2019-08-03 LAB — ANA: Anti Nuclear Antibody (ANA): NEGATIVE

## 2019-08-03 LAB — SEDIMENTATION RATE: Sed Rate: 19 mm/h — ABNORMAL HIGH (ref 0–15)

## 2019-08-04 ENCOUNTER — Encounter: Payer: Self-pay | Admitting: Podiatry

## 2019-08-04 ENCOUNTER — Other Ambulatory Visit: Payer: Self-pay

## 2019-08-04 ENCOUNTER — Ambulatory Visit (INDEPENDENT_AMBULATORY_CARE_PROVIDER_SITE_OTHER): Payer: Medicaid Other

## 2019-08-04 ENCOUNTER — Ambulatory Visit (INDEPENDENT_AMBULATORY_CARE_PROVIDER_SITE_OTHER): Payer: Medicaid Other | Admitting: Podiatry

## 2019-08-04 DIAGNOSIS — Z09 Encounter for follow-up examination after completed treatment for conditions other than malignant neoplasm: Secondary | ICD-10-CM

## 2019-08-04 DIAGNOSIS — M21622 Bunionette of left foot: Secondary | ICD-10-CM

## 2019-08-04 DIAGNOSIS — M216X2 Other acquired deformities of left foot: Secondary | ICD-10-CM

## 2019-08-04 DIAGNOSIS — M7989 Other specified soft tissue disorders: Secondary | ICD-10-CM

## 2019-08-04 MED ORDER — ALLOPURINOL 100 MG PO TABS: 100.0000 mg | ORAL_TABLET | Freq: Every day | ORAL | 0 refills | Status: DC

## 2019-08-04 NOTE — Patient Instructions (Signed)
Continue antibiotics Monitor for any signs/symptoms of infection. Call the office immediately if any occur or go directly to the emergency room. Call with any questions/concerns.  

## 2019-08-06 NOTE — Progress Notes (Signed)
Subjective: Edwin Rauner Sr. is a 48 y.o. is seen today in office s/p left foot soft tissue mass excision, exostectomy metatarsal head preformed on 06/14/2019.  Overall he states he is doing better since switching antibiotic.  He is having no pain he denies any drainage or pus.  He presents today without wearing a bandage.  However he feels well denies any fevers, chills, nausea, vomiting.  No calf pain, chest pain, shortness of breath.  Objective: General: No acute distress, AAOx3  DP/PT pulses palpable 2/4, CRT < 3 sec to all digits.  Protective sensation intact. Motor function intact.  LEFT foot: There is decreased edema to the surgical site there is no erythema or warmth.  Not able to elicit any tenderness.  There are 2 small openings present within the incision which still probes approximate 0.4 cm but there is no probing to bone, undermining or tunneling.  There is no fluctuation or crepitation.  There is no malodor. No other open lesions. No pain with calf compression, swelling, warmth, erythema.         Assessment and Plan:  Status post left foot surgery  -Treatment options discussed including all alternatives, risks, and complications -X-rays were obtained and reviewed.  Left metatarsal there is been stable without any worsening osteolysis.  No soft tissue emphysema. -I did sharply debrided the hyperkeratotic tissue that was over the incision.  Continue daily dressing changes with a small amount of antibiotic ointment and a bandage daily.  Keep the wound covered.  -Continue surgical shoe -Finish course of antibiotics. -Start allopurinol 100 mg daily. 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  Trula Slade DPM

## 2019-08-07 ENCOUNTER — Telehealth: Payer: Self-pay | Admitting: *Deleted

## 2019-08-07 DIAGNOSIS — Z09 Encounter for follow-up examination after completed treatment for conditions other than malignant neoplasm: Secondary | ICD-10-CM

## 2019-08-07 DIAGNOSIS — M21622 Bunionette of left foot: Secondary | ICD-10-CM

## 2019-08-07 DIAGNOSIS — M7989 Other specified soft tissue disorders: Secondary | ICD-10-CM

## 2019-08-07 DIAGNOSIS — R609 Edema, unspecified: Secondary | ICD-10-CM

## 2019-08-07 NOTE — Telephone Encounter (Signed)
-----   Message from Trula Slade, DPM sent at 08/06/2019  8:17 AM EDT ----- Can you please put in a referral for rheumatology? My last note is done and can you please include the pathology from surgery and his latest blood work? Thanks.

## 2019-08-07 NOTE — Telephone Encounter (Signed)
[facility] - scheduler states they do not accept Medicaid.

## 2019-08-07 NOTE — Telephone Encounter (Signed)
Unable to find [facility] rheumatologist that accepts Medicaid. Referral faxed to [facility].

## 2019-08-07 NOTE — Telephone Encounter (Signed)
[facility] - receptionist states their Rheumatologist does not accept Medicaid.

## 2019-08-07 NOTE — Telephone Encounter (Signed)
Left message for Clance Boll - Referral Coordinator - Dr. Arlean Hopping if accepted Medicaid.

## 2019-08-11 ENCOUNTER — Ambulatory Visit: Payer: Medicaid Other | Admitting: Podiatry

## 2019-08-22 IMAGING — US LEFT LOWER EXTREMITY SOFT TISSUE ULTRASOUND LIMITED
2 series · 14 of 20 positions shown · non-contrast
Comparison: Radiographs dated 01/05/2019 and 11/26/2015

CLINICAL DATA: Soft tissue mass of the left foot.

EXAM:
ULTRASOUND LEFT LOWER EXTREMITY LIMITED
TECHNIQUE: Ultrasound examination of the lower extremity soft tissues was
performed in the area of clinical concern.

[Series 1: left lower extremity soft tissue ultrasound limite · 0.05mm/px · 16 acquisitions, 11 frames shown (1 of 2)]
[im 1/16]
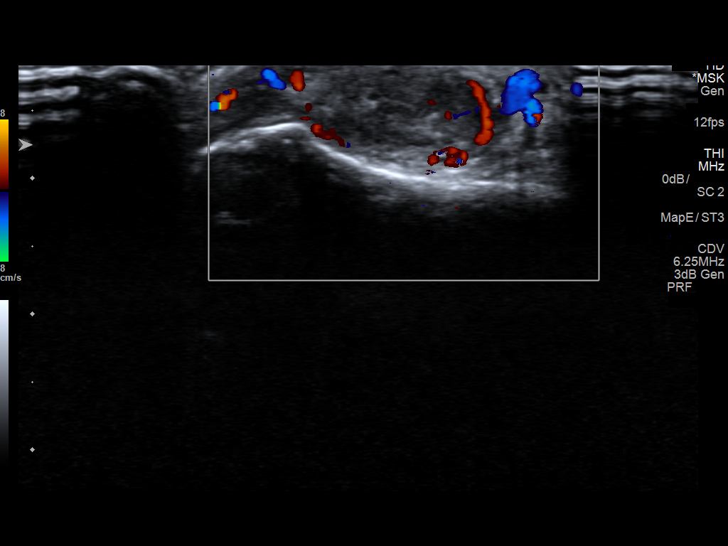
[im 3/16]
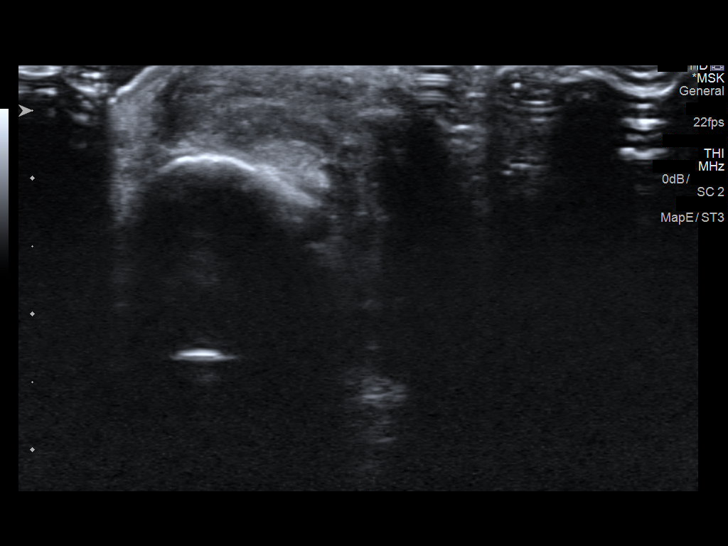
[im 4/16]
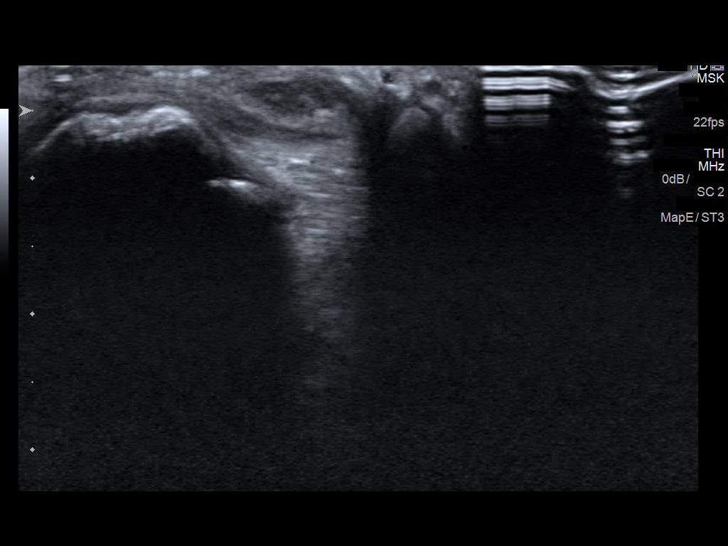
[im 6/16]
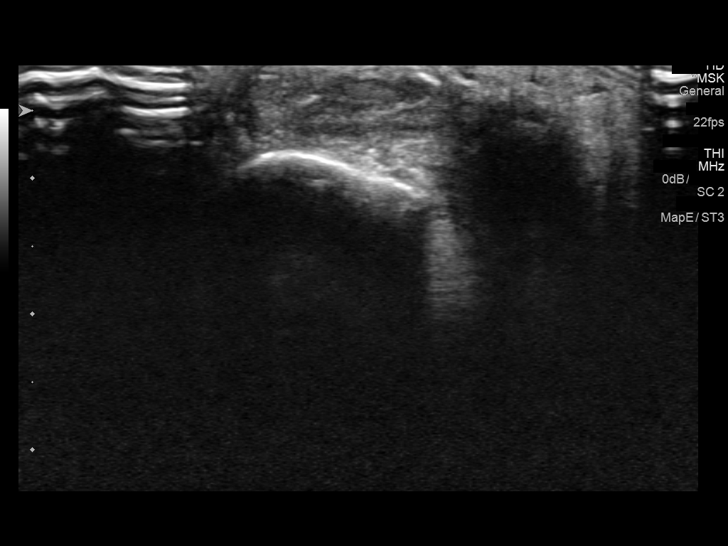
[im 7/16]
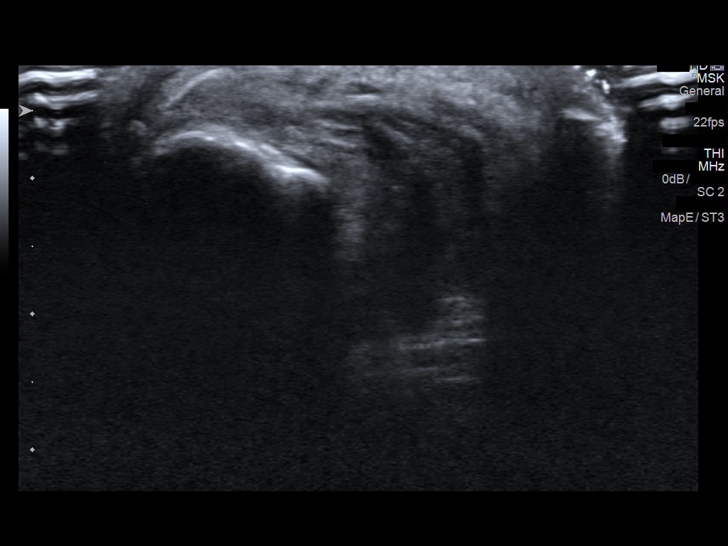
[im 8/16]
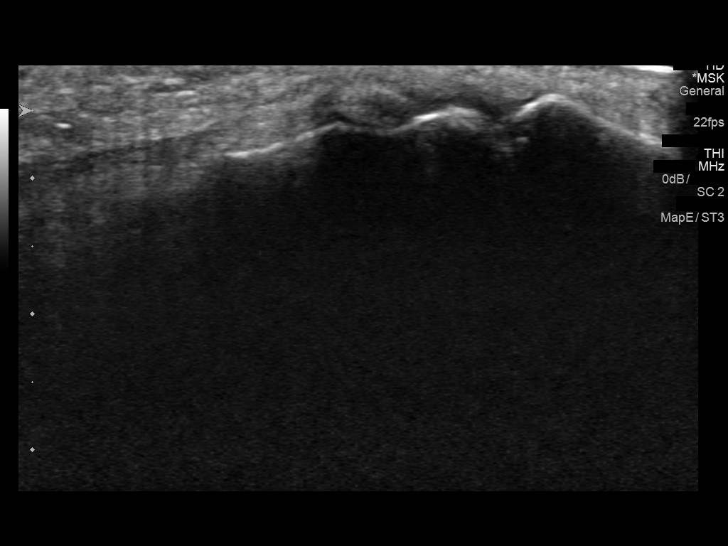
[im 10/16]
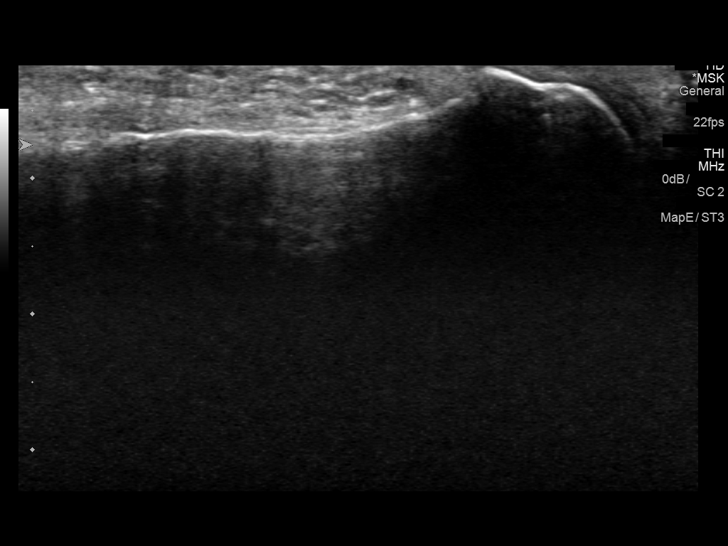
[im 11/16]
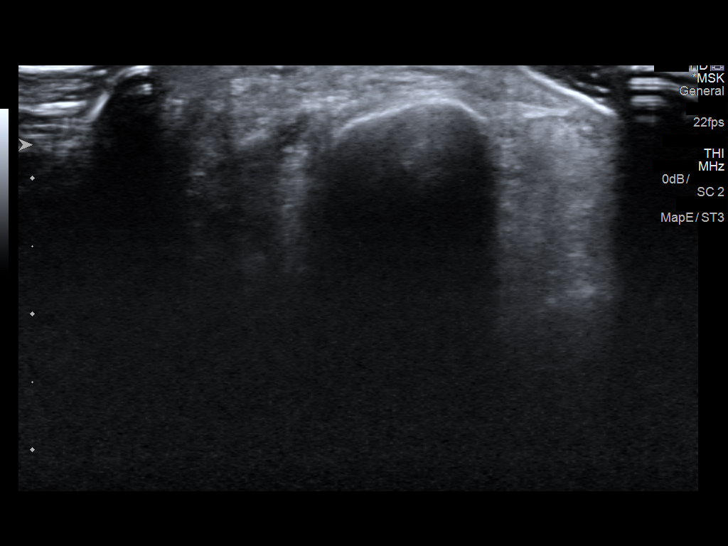
[im 13/16]
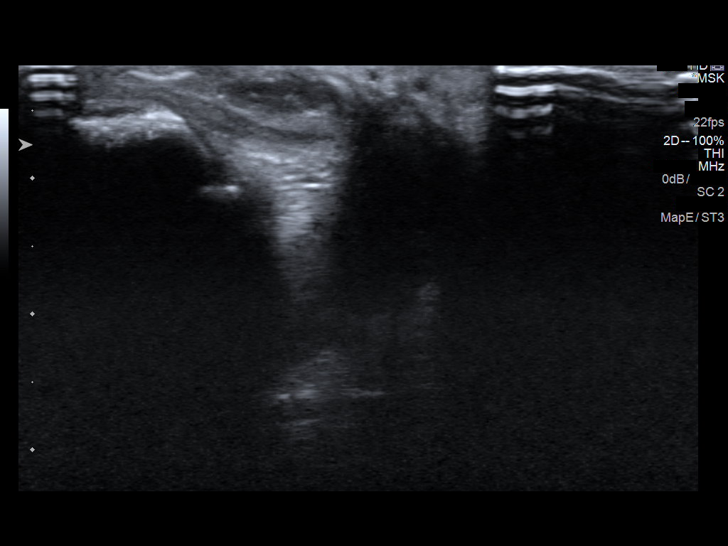
[im 14/16]
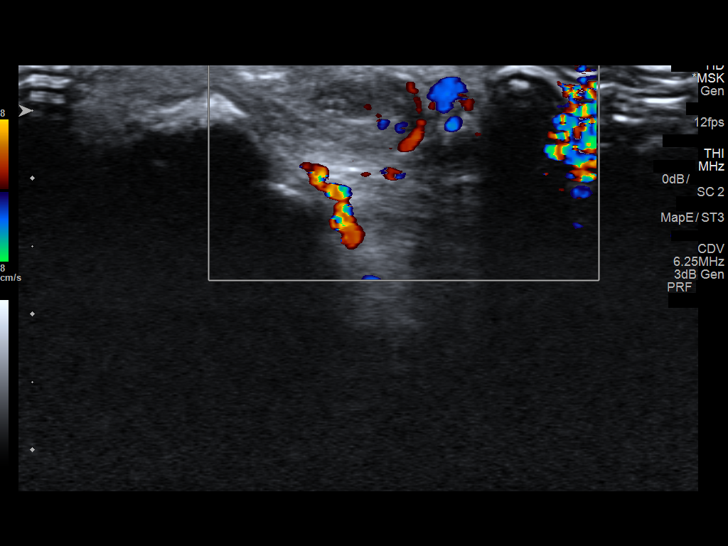
[im 16/16]
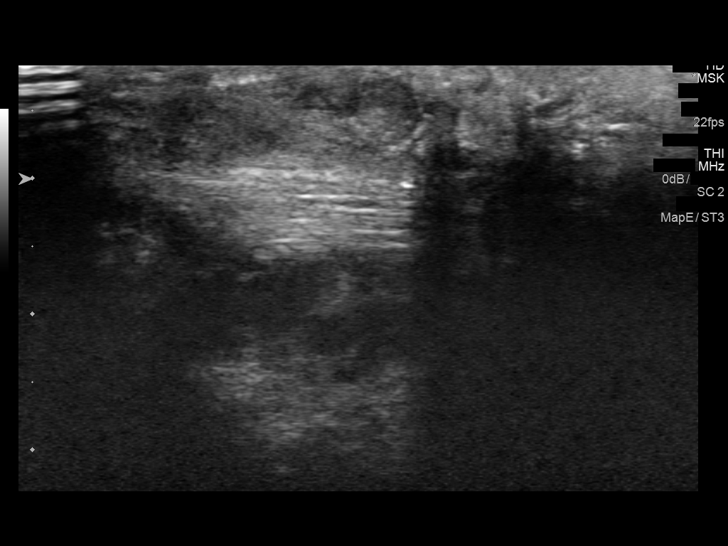

[Series 2: left lower extremity soft tissue ultrasound limite · 0.05mm/px · 3 of 4 slices shown (2 of 2)]
[im 1/4]
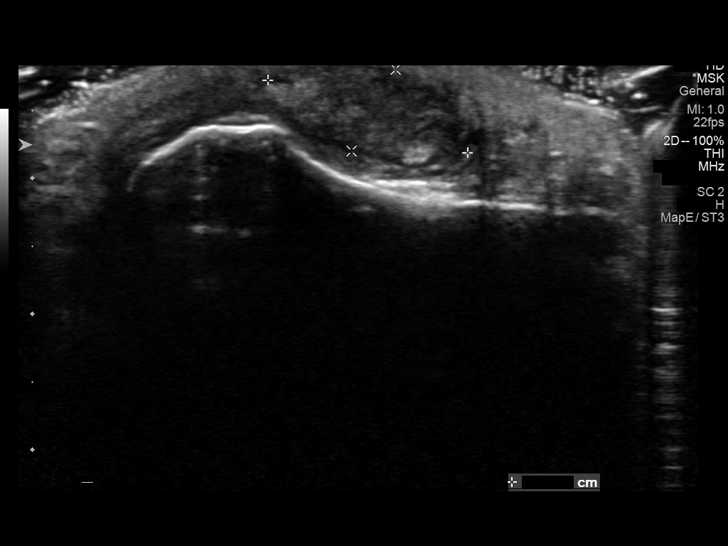
[im 2/4]
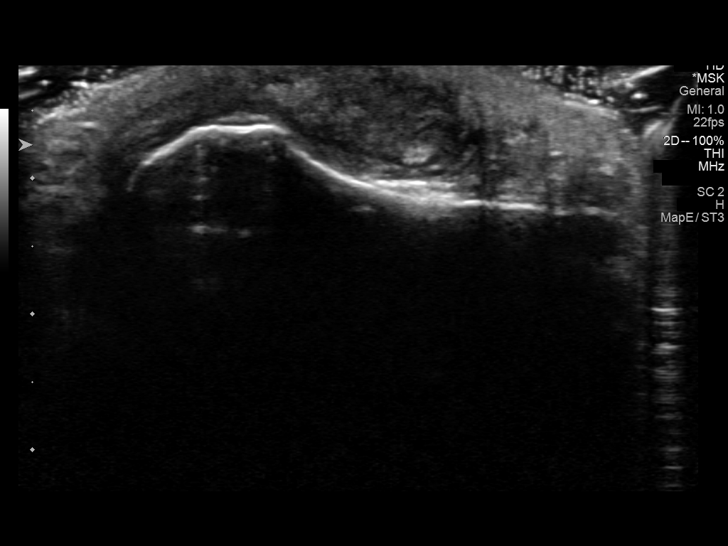
[im 4/4]
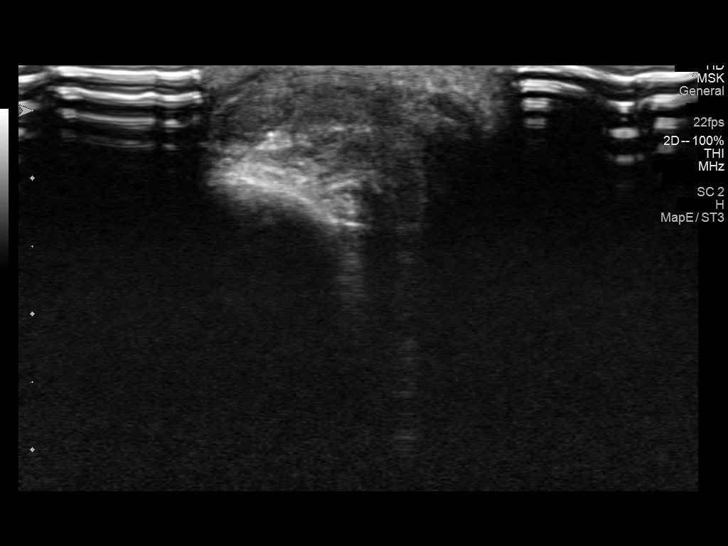

[14 of 20 positions shown; findings below may reference images not displayed]

FINDINGS: There is a 16 x 15 x 7 mm inhomogeneous soft tissue mass adjacent to
the lateral aspect of the fifth metatarsal. There is some blood flow
in and around this lesion. There is no adjacent effusion in the
fifth MTP joint. There are small areas of increased echogenicity
within the mass without posterior acoustical shadowing. These could
represent tiny areas of calcification. There is no definitive
foreign body in the soft tissues. The mass does not appear to
involve the tendons.
IMPRESSION: Solid soft tissue mass in the subcutaneous tissues lateral to the
fifth MTP joint. There is some vascularity to the mass. The
appearance is nonspecific but consistent with a neoplasm.

## 2019-08-24 ENCOUNTER — Ambulatory Visit (INDEPENDENT_AMBULATORY_CARE_PROVIDER_SITE_OTHER): Payer: Medicaid Other

## 2019-08-24 ENCOUNTER — Encounter: Payer: Self-pay | Admitting: Podiatry

## 2019-08-24 ENCOUNTER — Other Ambulatory Visit: Payer: Self-pay

## 2019-08-24 ENCOUNTER — Ambulatory Visit (INDEPENDENT_AMBULATORY_CARE_PROVIDER_SITE_OTHER): Payer: Medicaid Other | Admitting: Podiatry

## 2019-08-24 DIAGNOSIS — M21622 Bunionette of left foot: Secondary | ICD-10-CM

## 2019-08-24 DIAGNOSIS — M86672 Other chronic osteomyelitis, left ankle and foot: Secondary | ICD-10-CM | POA: Diagnosis not present

## 2019-08-24 DIAGNOSIS — M7989 Other specified soft tissue disorders: Secondary | ICD-10-CM

## 2019-08-24 MED ORDER — FLUCONAZOLE 150 MG PO TABS: 150.0000 mg | ORAL_TABLET | ORAL | 0 refills | Status: DC

## 2019-08-24 NOTE — Patient Instructions (Signed)
Fluconazole tablets What is this medicine? FLUCONAZOLE (floo KON na zole) is an antifungal medicine. It is used to treat certain kinds of fungal or yeast infections. This medicine may be used for other purposes; ask your health care provider or pharmacist if you have questions. COMMON BRAND NAME(S): Diflucan What should I tell my health care provider before I take this medicine? They need to know if you have any of these conditions:  history of irregular heart beat  kidney disease  an unusual or allergic reaction to fluconazole, other azole antifungals, medicines, foods, dyes, or preservatives  pregnant or trying to get pregnant  breast-feeding How should I use this medicine? Take this medicine by mouth. Follow the directions on the prescription label. Do not take your medicine more often than directed. Talk to your pediatrician regarding the use of this medicine in children. Special care may be needed. This medicine has been used in children as young as 6 months of age. Overdosage: If you think you have taken too much of this medicine contact a poison control center or emergency room at once. NOTE: This medicine is only for you. Do not share this medicine with others. What if I miss a dose? If you miss a dose, take it as soon as you can. If it is almost time for your next dose, take only that dose. Do not take double or extra doses. What may interact with this medicine? Do not take this medicine with any of the following medications:  astemizole  certain medicines for irregular heart beat like dronedarone, quinidine  cisapride  erythromycin  lomitapide  other medicines that prolong the QT interval (cause an abnormal heart rhythm)  pimozide  terfenadine  thioridazine  tolvaptan This medicine may also interact with the following medications:  antiviral medicines for HIV or AIDS  birth control pills  certain antibiotics like rifabutin, rifampin  certain medicines  for blood pressure like amlodipine, isradipine, felodipine, hydrochlorothiazide, losartan, nifedipine  certain medicines for cancer like cyclophosphamide, vinblastine, vincristine  certain medicines for cholesterol like atorvastatin, lovastatin, fluvastatin, simvastatin  certain medicines for depression, anxiety, or psychotic disturbances like amitriptyline, midazolam, nortriptyline, triazolam  certain medicines for diabetes like glipizide, glyburide, tolbutamide  certain medicines for pain like alfentanil, fentanyl, methadone  certain medicines for seizures like carbamazepine, phenytoin  certain medicines that treat or prevent blood clots like warfarin  dofetilide  halofantrine  medicines that lower your chance of fighting infection like cyclosporine, prednisone, tacrolimus  NSAIDS, medicines for pain and inflammation, like celecoxib, diclofenac, flurbiprofen, ibuprofen, meloxicam, naproxen  other medicines for fungal infections  sirolimus  theophylline  tofacitinib  ziprasidone This list may not describe all possible interactions. Give your health care provider a list of all the medicines, herbs, non-prescription drugs, or dietary supplements you use. Also tell them if you smoke, drink alcohol, or use illegal drugs. Some items may interact with your medicine. What should I watch for while using this medicine? Visit your doctor or health care professional for regular checkups. If you are taking this medicine for a long time you may need blood work. Tell your doctor if your symptoms do not improve. Some fungal infections need many weeks or months of treatment to cure. Alcohol can increase possible damage to your liver. Avoid alcoholic drinks. If you have a vaginal infection, do not have sex until you have finished your treatment. You can wear a sanitary napkin. Do not use tampons. Wear freshly washed cotton, not synthetic, panties. What side effects may   I notice from receiving  this medicine? Side effects that you should report to your doctor or health care professional as soon as possible:  allergic reactions like skin rash or itching, hives, swelling of the lips, mouth, tongue, or throat  dark urine  feeling dizzy or faint  irregular heartbeat or chest pain  redness, blistering, peeling or loosening of the skin, including inside the mouth  trouble breathing  unusual bruising or bleeding  vomiting  yellowing of the eyes or skin Side effects that usually do not require medical attention (report to your doctor or health care professional if they continue or are bothersome):  changes in how food tastes  diarrhea  headache  stomach upset or nausea This list may not describe all possible side effects. Call your doctor for medical advice about side effects. You may report side effects to FDA at 1-800-FDA-1088. Where should I keep my medicine? Keep out of the reach of children. Store at room temperature below 30 degrees C (86 degrees F). Throw away any medicine after the expiration date. NOTE: This sheet is a summary. It may not cover all possible information. If you have questions about this medicine, talk to your doctor, pharmacist, or health care provider.  2020 Elsevier/Gold Standard (2018-10-31 11:58:26)  

## 2019-08-28 NOTE — Progress Notes (Signed)
Subjective: Edwin Seidl Sr. is a 48 y.o. is seen today in office s/p left foot soft tissue mass excision, exostectomy metatarsal head preformed on 06/14/2019.  Overall he states he is doing well.  Is not been keeping a bandage on the incision because it is healed.  He denies any drainage or pus.  Still with some swelling but he denies any redness or warmth.  He states he is feeling much better.  He did not get the allopurinol due to cost.  He states he will be getting today.  Denies any fevers, chills, nausea, vomiting.  No calf pain, chest pain, shortness of breath.  Objective: General: No acute distress, AAOx3  DP/PT pulses palpable 2/4, CRT < 3 sec to all digits.  Protective sensation intact. Motor function intact.  LEFT foot: There is decreased edema to the surgical site there is no erythema or warmth.  Hyperkeratotic tissue overlying the incision.  Upon debridement there is one central area that is has a small superficial opening but there is no probing, undermining or tunneling.  Mild edema but there is no erythema or warmth.  No fluctuation or crepitation.  There is no pain to palpation. No other open lesions. No pain with calf compression, swelling, warmth, erythema.   Assessment and Plan:  Status post left foot surgery; gout  -Treatment options discussed including all alternatives, risks, and complications -He is awaiting rheumatology referral.  He is undergoing stress urinary given the increased uric acid.  He is to pick this up today but if he does not get this to let me know. -Regards to surgical site appears to be doing better.  Is almost completely healed.  I do want to keep antibiotic ointment and a bandage today.  Continue compression, elevation.  Monitor closely for any signs or symptoms of infection.  I did obtain new x-rays today.  There is cortical destruction to the lateral fifth metatarsal head on the AP view seems to be consolidating.  There was a small area of soft  tissue air but this corresponds to the wound.  Clinically does not appear to be infected. -He is asking for new pain management physician.  Recommend him to see his primary care physician for this.  RTC 2 weeks  Trula Slade DPM

## 2019-09-07 ENCOUNTER — Ambulatory Visit (INDEPENDENT_AMBULATORY_CARE_PROVIDER_SITE_OTHER): Payer: Self-pay | Admitting: Podiatry

## 2019-09-07 ENCOUNTER — Other Ambulatory Visit: Payer: Self-pay

## 2019-09-07 ENCOUNTER — Encounter: Payer: Self-pay | Admitting: Podiatry

## 2019-09-07 DIAGNOSIS — R609 Edema, unspecified: Secondary | ICD-10-CM

## 2019-09-07 DIAGNOSIS — Z09 Encounter for follow-up examination after completed treatment for conditions other than malignant neoplasm: Secondary | ICD-10-CM

## 2019-09-07 MED ORDER — GABAPENTIN 300 MG PO CAPS: 600.0000 mg | ORAL_CAPSULE | Freq: Three times a day (TID) | ORAL | 2 refills | Status: DC

## 2019-09-07 NOTE — Progress Notes (Signed)
Subjective:  Patient ID: Edwin Rodriguez., male    DOB: [birth year],  MRN: [id number]  Chief Complaint  Patient presents with  . Wound Check    pt is here for a wound check of the lateral side of the left foot, pt states that the wound is looking the same since he has last been here, pt also states that he is looking to get pain medication as well    DOS: 06/14/2019 Procedure: Left foot soft tissue mass excision with exostectomy of the metatarsal head  48 y.o. male returns for post-op check.  He states that he is doing pretty well.  Denies any other acute complaints.  He states that he started still has some shooting tingling pain and would like a gabapentin refill.  Review of Systems: Negative except as noted in the HPI. Denies N/V/F/Ch.  Past Medical History:  Diagnosis Date  . Arthritis    "knees" (08/07/2014)  . Chronic kidney disease    "borderline"  . DVT (deep venous thrombosis) ([place])   . Fracture of leg    left lower  . History of blood transfusion    "related to blood loss when I had surgery"  . Hypertension    "does not have primary doctor at this time"  . Osteomyelitis of left leg ([facility])   . PE (pulmonary embolism)     Current Outpatient Medications:  .  allopurinol (ZYLOPRIM) 100 MG tablet, Take 1 tablet (100 mg total) by mouth daily., Disp: 30 tablet, Rfl: 0 .  Amlodipine-Valsartan-HCTZ (EXFORGE HCT) 10-320-25 MG TABS, Take 1 tablet by mouth daily., Disp: , Rfl:  .  Aspirin-Acetaminophen-Caffeine (GOODY HEADACHE PO), Take 1 packet by mouth as needed (for headaches)., Disp: , Rfl:  .  ciprofloxacin (CIPRO) 500 MG tablet, Take 1 tablet (500 mg total) by mouth 2 (two) times daily., Disp: 20 tablet, Rfl: 0 .  clindamycin (CLEOCIN) 300 MG capsule, Take 1 capsule (300 mg total) by mouth 3 (three) times daily., Disp: 30 capsule, Rfl: 0 .  fluconazole (DIFLUCAN) 150 MG tablet, Take 1 tablet (150 mg total) by mouth once a week., Disp: 12 tablet, Rfl: 0 .  gabapentin  (NEURONTIN) 300 MG capsule, Take 2 capsules (600 mg total) by mouth 3 (three) times daily., Disp: 90 capsule, Rfl: 2 .  HYDROcodone-acetaminophen (NORCO/VICODIN) 5-325 MG tablet, Take 1 tablet by mouth every 6 (six) hours as needed., Disp: 15 tablet, Rfl: 0 .  methylPREDNISolone (MEDROL DOSEPAK) 4 MG TBPK tablet, Take as directed, Disp: 21 tablet, Rfl: 0 .  naproxen sodium (ALEVE) 220 MG tablet, Take 220-440 mg by mouth 2 (two) times daily as needed (for pain or headaches). , Disp: , Rfl:  .  NARCAN 4 MG/0.1ML LIQD nasal spray kit, Place 1 spray into the nose once as needed (AS DIRECTED). , Disp: , Rfl: 0 .  omeprazole (PRILOSEC) 20 MG capsule, TK 1 C PO D UTD, Disp: , Rfl:  .  oxyCODONE (OXY IR/ROXICODONE) 5 MG immediate release tablet, Take 5 mg by mouth 4 (four) times daily as needed (for pain). , Disp: , Rfl:  .  promethazine (PHENERGAN) 25 MG tablet, Take 1 tablet (25 mg total) by mouth every 8 (eight) hours as needed for nausea or vomiting., Disp: 20 tablet, Rfl: 0 .  sildenafil (VIAGRA) 100 MG tablet, Take 100 mg by mouth See admin instructions., Disp: , Rfl:  .  SUBOXONE 8-2 MG FILM, DIS 1 FILM UNT QPM FOR 30 DAYS, Disp: , Rfl:  Social History   Tobacco Use  Smoking Status Current Every Day Smoker  . Packs/day: 0.25  . Years: 24.00  . Pack years: 6.00  . Types: Cigarettes  Smokeless Tobacco Never Used    No Known Allergies Objective:  There were no vitals filed for this visit. There is no height or weight on file to calculate BMI. Constitutional Well developed. Well nourished.  Vascular Foot warm and well perfused. Capillary refill normal to all digits.   Neurologic Normal speech. Oriented to person, place, and time. Epicritic sensation to light touch grossly present bilaterally.  Dermatologic Skin healing well without signs of infection. Skin edges well coapted without signs of infection.  Orthopedic: Tenderness to palpation noted about the surgical site.   Radiographs:  None Assessment:   1. Surgery follow-up   2. Swelling    Plan:  Patient was evaluated and treated and all questions answered.  S/p foot surgery left -Progressing as expected post-operatively. -XR: None -WB Status: WBAT in regular sneakers -Sutures: None. -Medications: Gabapentin for nerve related pain he states that that has been really helping him. -It was pleasure seeing him today however given that he has considerably improved with all incisions healed weightbearing in regular sneakers.  I told him that he is officially discharged from my care on this if he has any other other complaints or concerns.  And he can always come back to see me if any issues arises in the future. -Foot redressed.  No follow-ups on file.  

## 2019-09-21 ENCOUNTER — Ambulatory Visit: Payer: Medicaid Other | Admitting: Podiatry

## 2019-09-24 ENCOUNTER — Other Ambulatory Visit: Payer: Self-pay

## 2019-09-24 ENCOUNTER — Ambulatory Visit
Admission: EM | Admit: 2019-09-24 | Discharge: 2019-09-24 | Disposition: A | Discharge status: Home / Self Care | Payer: Medicaid Other | Attending: Physician Assistant | Admitting: Physician Assistant

## 2019-09-24 ENCOUNTER — Encounter: Payer: Self-pay | Admitting: Emergency Medicine

## 2019-09-24 ENCOUNTER — Ambulatory Visit (INDEPENDENT_AMBULATORY_CARE_PROVIDER_SITE_OTHER): Payer: Medicaid Other

## 2019-09-24 DIAGNOSIS — Z20828 Contact with and (suspected) exposure to other viral communicable diseases: Secondary | ICD-10-CM

## 2019-09-24 DIAGNOSIS — R0602 Shortness of breath: Secondary | ICD-10-CM

## 2019-09-24 DIAGNOSIS — R059 Cough, unspecified: Secondary | ICD-10-CM

## 2019-09-24 DIAGNOSIS — J22 Unspecified acute lower respiratory infection: Secondary | ICD-10-CM | POA: Diagnosis not present

## 2019-09-24 DIAGNOSIS — R05 Cough: Secondary | ICD-10-CM

## 2019-09-24 DIAGNOSIS — I10 Essential (primary) hypertension: Secondary | ICD-10-CM | POA: Diagnosis not present

## 2019-09-24 MED ORDER — ALBUTEROL SULFATE HFA 108 (90 BASE) MCG/ACT IN AERS: 1.0000 | INHALATION_SPRAY | Freq: Four times a day (QID) | RESPIRATORY_TRACT | 0 refills | Status: DC | PRN

## 2019-09-24 MED ORDER — PREDNISONE 50 MG PO TABS: 50.0000 mg | ORAL_TABLET | Freq: Every day | ORAL | 0 refills | Status: DC

## 2019-09-24 MED ORDER — DOXYCYCLINE HYCLATE 100 MG PO CAPS: 100.0000 mg | ORAL_CAPSULE | Freq: Two times a day (BID) | ORAL | 0 refills | Status: DC

## 2019-09-24 NOTE — Discharge Instructions (Signed)
As discussed, cannot rule out COVID. Currently, no alarming signs. Testing ordered. I would like you to quarantine until testing results. Doxycycline and prednisone as directed. Albuterol as needed. If any worsening of symptoms, go to the emergency department as directed.

## 2019-09-24 NOTE — ED Notes (Signed)
Patient able to ambulate independently  

## 2019-09-24 NOTE — ED Triage Notes (Signed)
Pt presents to [facility] for assessment of cough at night x 1.5 weeks.  C/o mild nasal congestion.  Pt also c/o "one night I felt short of breath", denies SOB at this time.  Pt states his left back has begun to hurt when he coughs.  SpoO2 93% on RA at triage.
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
## 2019-09-25 ENCOUNTER — Telehealth: Payer: Self-pay | Admitting: Emergency Medicine

## 2019-09-25 NOTE — Telephone Encounter (Signed)
Left voicemail checking in on patient, and encouraged return call with any continuing questions or concerns.    

## 2019-09-25 NOTE — Telephone Encounter (Signed)
Checked in on patient, discussed medications, and encouraged return call with any continuing questions or concerns.    

## 2019-09-26 ENCOUNTER — Telehealth: Payer: Self-pay | Admitting: Emergency Medicine

## 2019-09-26 NOTE — Telephone Encounter (Signed)
Left voicemail checking in on patient

## 2019-09-27 LAB — NOVEL CORONAVIRUS, NAA: SARS-CoV-2, NAA: NOT DETECTED

## 2019-10-01 ENCOUNTER — Other Ambulatory Visit: Payer: Self-pay

## 2019-10-01 ENCOUNTER — Encounter (HOSPITAL_COMMUNITY): Payer: Self-pay | Admitting: Emergency Medicine

## 2019-10-01 ENCOUNTER — Emergency Department (HOSPITAL_COMMUNITY)
Admission: EM | Admit: 2019-10-01 | Discharge: 2019-10-02 | Disposition: A | Discharge status: Home / Self Care | Payer: Medicaid Other | Attending: Emergency Medicine | Admitting: Emergency Medicine

## 2019-10-01 ENCOUNTER — Emergency Department (HOSPITAL_COMMUNITY): Payer: Medicaid Other

## 2019-10-01 DIAGNOSIS — F17210 Nicotine dependence, cigarettes, uncomplicated: Secondary | ICD-10-CM | POA: Insufficient documentation

## 2019-10-01 DIAGNOSIS — Z79899 Other long term (current) drug therapy: Secondary | ICD-10-CM | POA: Insufficient documentation

## 2019-10-01 DIAGNOSIS — N189 Chronic kidney disease, unspecified: Secondary | ICD-10-CM | POA: Insufficient documentation

## 2019-10-01 DIAGNOSIS — R0981 Nasal congestion: Secondary | ICD-10-CM | POA: Insufficient documentation

## 2019-10-01 DIAGNOSIS — R0781 Pleurodynia: Secondary | ICD-10-CM | POA: Diagnosis not present

## 2019-10-01 DIAGNOSIS — I129 Hypertensive chronic kidney disease with stage 1 through stage 4 chronic kidney disease, or unspecified chronic kidney disease: Secondary | ICD-10-CM | POA: Insufficient documentation

## 2019-10-01 DIAGNOSIS — R05 Cough: Secondary | ICD-10-CM | POA: Diagnosis present

## 2019-10-01 LAB — BASIC METABOLIC PANEL
Anion gap: 10 (ref 5–15)
BUN: 13 mg/dL (ref 6–20)
CO2: 25 mmol/L (ref 22–32)
Calcium: 9.1 mg/dL (ref 8.9–10.3)
Chloride: 106 mmol/L (ref 98–111)
Creatinine, Ser: 1.39 mg/dL — ABNORMAL HIGH (ref 0.61–1.24)
GFR calc Af Amer: >60 mL/min (ref >60)
GFR calc non Af Amer: 60 mL/min — ABNORMAL LOW (ref >60)
Glucose, Bld: 85 mg/dL (ref 70–99)
Potassium: 3.7 mmol/L (ref 3.5–5.1)
Sodium: 141 mmol/L (ref 135–145)

## 2019-10-01 LAB — TROPONIN I (HIGH SENSITIVITY)
Troponin I (High Sensitivity): 13 ng/L (ref <18)
Troponin I (High Sensitivity): 13 ng/L (ref <18)

## 2019-10-01 LAB — CBC
HCT: 43.2 % (ref 39.0–52.0)
Hemoglobin: 14.2 g/dL (ref 13.0–17.0)
MCH: 30.1 pg (ref 26.0–34.0)
MCHC: 32.9 g/dL (ref 30.0–36.0)
MCV: 91.5 fL (ref 80.0–100.0)
Platelets: 275 10*3/uL (ref 150–400)
RBC: 4.72 MIL/uL (ref 4.22–5.81)
RDW: 15 % (ref 11.5–15.5)
WBC: 5.9 10*3/uL (ref 4.0–10.5)
nRBC: 0 % (ref 0.0–0.2)

## 2019-10-01 MED ORDER — SODIUM CHLORIDE 0.9% FLUSH: 3.0000 mL | Freq: Once | INTRAVENOUS | Status: DC

## 2019-10-01 NOTE — ED Triage Notes (Signed)
Pt c/o worsening productive cough with L rib pain, pt seen [facility] Sunday given Antbx and steroids.  Pt denies fever.

## 2019-10-02 ENCOUNTER — Emergency Department (HOSPITAL_COMMUNITY): Payer: Medicaid Other

## 2019-10-02 LAB — D-DIMER, QUANTITATIVE: D-Dimer, Quant: 1.48 ug/mL-FEU — ABNORMAL HIGH (ref 0.00–0.50)

## 2019-10-02 MED ORDER — IOHEXOL 350 MG/ML SOLN
100.0000 mL | Freq: Once | INTRAVENOUS | Status: AC | PRN
  Administered 2019-10-02: 100 mL via INTRAVENOUS

## 2019-10-02 MED ORDER — BENZONATATE 100 MG PO CAPS
100.0000 mg | ORAL_CAPSULE | Freq: Once | ORAL | Status: AC
  Administered 2019-10-02: 100 mg via ORAL
  Filled 2019-10-02: qty 1

## 2019-10-02 MED ORDER — BENZONATATE 100 MG PO CAPS: 100.0000 mg | ORAL_CAPSULE | Freq: Three times a day (TID) | ORAL | 0 refills | Status: DC | PRN

## 2019-10-02 NOTE — ED Notes (Addendum)
Patient reports L sided posterior rib pain that began yesterday morning after being dx with respiratory infection. States he has a history of PE and is concerned because his pain feels like it did when he had a PE in the past. Denies fever, chills, body aches or sore throat. Denies any recent known sick contacts but states he lives in a boarding house that has a lot of mold.

## 2019-10-02 NOTE — ED Notes (Signed)
Pt provided sandwich and sprite per request

## 2019-10-02 NOTE — Discharge Instructions (Signed)
You were seen in the emergency department today for chest pain. Your work-up was overall reassuring. Your labs show that your kidney function is a bit elevated but this is similar to prior labs on record.

## 2019-10-02 NOTE — ED Notes (Signed)
Patient verbalizes understanding of discharge instructions. Opportunity for questioning and answers were provided. Armband removed by staff, pt discharged from ED.  

## 2019-10-02 NOTE — ED Notes (Signed)
ED Provider at bedside. 

## 2019-10-02 NOTE — ED Provider Notes (Signed)
06:30 Assumed care of patient from Antonietta Breach PA-C at change of shift pending D-dimer & disposition.   Please see prior provider note for full H&P.  Briefly patient is a 48 yo male who presented to the ED with complaints of pleuritic type chest pain.  Seen @ UC 8 days prior for URI sxs- given steroids, albuterol inhaler, & doxycycline with some improvement until a few days ago when he developed pleuritic chest pain.    Physical Exam  BP 126/65   Pulse 86   Temp 98.1 F (36.7 C) (Oral)   Resp 20   Ht 5\' 11"  (1.803 m)   Wt 126 kg   SpO2 94%   BMI 38.74 kg/m   Physical Exam Vitals signs and nursing note reviewed.  Constitutional:      General: He is not in acute distress.    Appearance: He is well-developed.  HENT:     Head: Normocephalic and atraumatic.  Eyes:     General:        Right eye: No discharge.        Left eye: No discharge.     Conjunctiva/sclera: Conjunctivae normal.  Pulmonary:     Effort: Pulmonary effort is normal. No respiratory distress.  Neurological:     Mental Status: He is alert.     Comments: Clear speech.   Psychiatric:        Behavior: Behavior normal.        Thought Content: Thought content normal.     ED Course/Procedures     Results for orders placed or performed during the hospital encounter of [id number]  Basic metabolic panel  Result Value Ref Range   Sodium 141 135 - 145 mmol/L   Potassium 3.7 3.5 - 5.1 mmol/L   Chloride 106 98 - 111 mmol/L   CO2 25 22 - 32 mmol/L   Glucose, Bld 85 70 - 99 mg/dL   BUN 13 6 - 20 mg/dL   Creatinine, Ser 1.39 (H) 0.61 - 1.24 mg/dL   Calcium 9.1 8.9 - 10.3 mg/dL   GFR calc non Af Amer 60 (L) >60 mL/min   GFR calc Af Amer >60 >60 mL/min   Anion gap 10 5 - 15  CBC  Result Value Ref Range   WBC 5.9 4.0 - 10.5 K/uL   RBC 4.72 4.22 - 5.81 MIL/uL   Hemoglobin 14.2 13.0 - 17.0 g/dL   HCT 43.2 39.0 - 52.0 %   MCV 91.5 80.0 - 100.0 fL   MCH 30.1 26.0 - 34.0 pg   MCHC 32.9 30.0 - 36.0 g/dL   RDW 15.0  11.5 - 15.5 %   Platelets 275 150 - 400 K/uL   nRBC 0.0 0.0 - 0.2 %  D-dimer, quantitative (not at [facility])  Result Value Ref Range   D-Dimer, Quant 1.48 (H) 0.00 - 0.50 ug/mL-FEU  Troponin I (High Sensitivity)  Result Value Ref Range   Troponin I (High Sensitivity) 13 <18 ng/L  Troponin I (High Sensitivity)  Result Value Ref Range   Troponin I (High Sensitivity) 13 <18 ng/L   Dg Chest 2 View  Result Date: 09/24/2019 CLINICAL DATA:  Nonproductive cough. Left-sided chest pain and shortness of breath for 2 weeks. EXAM: CHEST - 2 VIEW COMPARISON:  12/12/2018 FINDINGS: The heart size and mediastinal contours are within normal limits. Stable diffuse pulmonary interstitial prominence, consistent with chronic interstitial lung disease. No evidence of numerous superimposed pulmonary infiltrate or pleural effusion. The visualized skeletal structures are unremarkable.  IMPRESSION: Stable chronic interstitial lung disease. No acute findings. Electronically Signed   By: Marlaine Hind M.D.   On: 09/24/2019 15:49   Ct Angio Chest Pe W/cm &/or Wo Cm  Result Date: 10/02/2019 CLINICAL DATA:  Chest pain. Positive D-dimer. Shortness of breath. EXAM: CT ANGIOGRAPHY CHEST WITH CONTRAST TECHNIQUE: Multidetector CT imaging of the chest was performed using the standard protocol during bolus administration of intravenous contrast. Multiplanar CT image reconstructions and MIPs were obtained to evaluate the vascular anatomy. CONTRAST:  80 cc OMNIPAQUE IOHEXOL 350 MG/ML SOLN COMPARISON:  CT scan dated 12/12/2018 FINDINGS: Cardiovascular: Satisfactory opacification of the pulmonary arteries to the segmental level. No evidence of pulmonary embolism. Normal heart size. No pericardial effusion. Minimal coronary artery calcification. Mediastinum/Nodes: No enlarged mediastinal, hilar, or axillary lymph nodes. Thyroid gland, trachea, and esophagus demonstrate no significant findings. Lungs/Pleura: Paraseptal emphysema primarily in the  periphery of the left upper lobe, unchanged. Minimal atelectasis at the lung bases. No effusions. Upper Abdomen: Negative. Musculoskeletal: No chest wall abnormality. No acute or significant osseous findings. Review of the MIP images confirms the above findings. IMPRESSION: 1. No pulmonary emboli or other acute abnormalities. 2. Paraseptal emphysema. 3. Minimal coronary artery calcification. Aortic Atherosclerosis (ICD10-I70.0) and Emphysema (ICD10-J43.9). Electronically Signed   By: Lorriane Shire M.D.   On: 10/02/2019 09:53   Dg Chest Portable 1 View  Result Date: 10/01/2019 CLINICAL DATA:  Cough, shortness of breath EXAM: PORTABLE CHEST 1 VIEW COMPARISON:  09/24/2019 FINDINGS: The heart size and mediastinal contours are within normal limits. Both lungs are clear. The visualized skeletal structures are unremarkable. IMPRESSION: No acute abnormality of the lungs in AP portable projection. Electronically Signed   By: Eddie Candle M.D.   On: 10/01/2019 21:33    Procedures  MDM   Work-up thus far reviewed:  Renal function similar to prior, troponin x 2 w/o delta elevation, labs overall unremarkable.  CXR w/o pneumothorax, infiltrate, or fluid overload.  EKG: No STEMI.   Plan @ change of shift is to follow up on D-dimer results If positive --> CTA If negative --> Discharge home with tessalon.   D-dimer elevated @ 1.48, remote hx of PE not on anticoagulation> CTA ordered & negative for PE.   Overall reassuring work-up.  Appears appropriate for discharge home with continuation of his previously prescribed medicines with addition of tessalon.   I discussed results, treatment plan, need for follow-up, and return precautions with the patient. Provided opportunity for questions, patient confirmed understanding and is in agreement with plan.   Edwin Warrick Knisely Sr. was evaluated in Emergency Department on 10/02/2019 for the symptoms described in the history of present illness. He/she was evaluated  in the context of the global COVID-19 pandemic, which necessitated consideration that the patient might be at risk for infection with the SARS-CoV-2 virus that causes COVID-19. Institutional protocols and algorithms that pertain to the evaluation of patients at risk for COVID-19 are in a state of rapid change based on information released by regulatory bodies including the CDC and federal and state organizations. These policies and algorithms were followed during the patient's care in the ED.        Amaryllis Dyke, PA-C 10/02/19 [place], DO 10/02/19 1032
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
## 2020-08-02 ENCOUNTER — Other Ambulatory Visit: Payer: Self-pay | Admitting: Critical Care Medicine

## 2020-08-02 ENCOUNTER — Other Ambulatory Visit: Payer: Medicaid Other

## 2020-08-02 DIAGNOSIS — Z20822 Contact with and (suspected) exposure to covid-19: Secondary | ICD-10-CM

## 2020-08-05 LAB — NOVEL CORONAVIRUS, NAA: SARS-CoV-2, NAA: NOT DETECTED

## 2020-08-06 ENCOUNTER — Telehealth: Payer: Self-pay | Admitting: *Deleted

## 2020-08-06 NOTE — Telephone Encounter (Signed)
Pt calling for covid results, negative, verbalizes understanding.

## 2021-01-24 ENCOUNTER — Encounter (HOSPITAL_COMMUNITY): Payer: Self-pay | Admitting: Emergency Medicine

## 2021-03-11 ENCOUNTER — Emergency Department (HOSPITAL_COMMUNITY)
Admission: EM | Admit: 2021-03-11 | Discharge: 2021-03-12 | Disposition: A | Discharge status: Home / Self Care | Payer: Medicaid Other | Attending: Emergency Medicine | Admitting: Emergency Medicine

## 2021-03-11 ENCOUNTER — Other Ambulatory Visit: Payer: Self-pay

## 2021-03-11 ENCOUNTER — Encounter (HOSPITAL_COMMUNITY): Payer: Self-pay | Admitting: Emergency Medicine

## 2021-03-11 ENCOUNTER — Emergency Department (HOSPITAL_COMMUNITY): Payer: Medicaid Other

## 2021-03-11 DIAGNOSIS — N189 Chronic kidney disease, unspecified: Secondary | ICD-10-CM | POA: Insufficient documentation

## 2021-03-11 DIAGNOSIS — F1910 Other psychoactive substance abuse, uncomplicated: Secondary | ICD-10-CM | POA: Insufficient documentation

## 2021-03-11 DIAGNOSIS — F1990 Other psychoactive substance use, unspecified, uncomplicated: Secondary | ICD-10-CM

## 2021-03-11 DIAGNOSIS — I129 Hypertensive chronic kidney disease with stage 1 through stage 4 chronic kidney disease, or unspecified chronic kidney disease: Secondary | ICD-10-CM | POA: Diagnosis not present

## 2021-03-11 DIAGNOSIS — F17210 Nicotine dependence, cigarettes, uncomplicated: Secondary | ICD-10-CM | POA: Insufficient documentation

## 2021-03-11 DIAGNOSIS — R519 Headache, unspecified: Secondary | ICD-10-CM | POA: Insufficient documentation

## 2021-03-11 DIAGNOSIS — R079 Chest pain, unspecified: Secondary | ICD-10-CM | POA: Diagnosis not present

## 2021-03-11 DIAGNOSIS — Z79899 Other long term (current) drug therapy: Secondary | ICD-10-CM | POA: Diagnosis not present

## 2021-03-11 LAB — CBC
HCT: 43.7 % (ref 39.0–52.0)
Hemoglobin: 14.4 g/dL (ref 13.0–17.0)
MCH: 32.1 pg (ref 26.0–34.0)
MCHC: 33 g/dL (ref 30.0–36.0)
MCV: 97.5 fL (ref 80.0–100.0)
Platelets: 169 10*3/uL (ref 150–400)
RBC: 4.48 MIL/uL (ref 4.22–5.81)
RDW: 14.1 % (ref 11.5–15.5)
WBC: 3.7 10*3/uL — ABNORMAL LOW (ref 4.0–10.5)
nRBC: 0 % (ref 0.0–0.2)

## 2021-03-11 LAB — COMPREHENSIVE METABOLIC PANEL WITH GFR
ALT: 39 U/L (ref 0–44)
AST: 26 U/L (ref 15–41)
Albumin: 3.2 g/dL — ABNORMAL LOW (ref 3.5–5.0)
Alkaline Phosphatase: 68 U/L (ref 38–126)
Anion gap: 6 (ref 5–15)
BUN: 11 mg/dL (ref 6–20)
CO2: 28 mmol/L (ref 22–32)
Calcium: 9.1 mg/dL (ref 8.9–10.3)
Chloride: 105 mmol/L (ref 98–111)
Creatinine, Ser: 1.21 mg/dL (ref 0.61–1.24)
GFR, Estimated: >60 mL/min
Glucose, Bld: 87 mg/dL (ref 70–99)
Potassium: 4 mmol/L (ref 3.5–5.1)
Sodium: 139 mmol/L (ref 135–145)
Total Bilirubin: 0.5 mg/dL (ref 0.3–1.2)
Total Protein: 6.5 g/dL (ref 6.5–8.1)

## 2021-03-11 LAB — TROPONIN I (HIGH SENSITIVITY): Troponin I (High Sensitivity): 9 ng/L

## 2021-03-11 MED ORDER — PROCHLORPERAZINE EDISYLATE 10 MG/2ML IJ SOLN: 10.0000 mg | Freq: Once | INTRAMUSCULAR | Status: DC

## 2021-03-11 MED ORDER — DIPHENHYDRAMINE HCL 50 MG/ML IJ SOLN: 12.5000 mg | Freq: Once | INTRAMUSCULAR | Status: DC

## 2021-03-11 MED ORDER — ACETAMINOPHEN 325 MG PO TABS
650.0000 mg | ORAL_TABLET | Freq: Once | ORAL | Status: AC
  Administered 2021-03-11: 650 mg via ORAL
  Filled 2021-03-11: qty 2

## 2021-03-11 NOTE — ED Triage Notes (Signed)
Pt arrives to ED for detox. Pt states he wants 4-5 days of help with detox from ETOH and cocaine. Last use for ETOH was Sunday 4/17 and cocaine was yesterday 4/18.  `
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
## 2021-03-11 NOTE — ED Triage Notes (Signed)
Emergency Medicine Provider Triage Evaluation Note  Edwin Swiney Sr. , a 50 y.o. male  was evaluated in triage.  Pt complains of wanting detox from alcohol and cocaine. Last use cocaine last night. Last alcohol two nights ago. States that he is sweaty, otherwise asymptomatic.   Review of Systems  Positive: sweaty Negative: CP, sob, HA, nausea   Physical Exam  BP (!) 163/122 (BP Location: Right Arm)   Pulse 64   Temp 98.3 F (36.8 C) (Oral)   Resp 16   SpO2 100%  Gen:   Awake, no distress   HEENT:  Atraumatic  Resp:  Normal effort  Cardiac:  Normal rate  Abd:   Nondistended, nontender  MSK:   Moves extremities without difficulty  Neuro:  Speech clear  Medical Decision Making  Medically screening exam initiated at 4:20 PM.  Appropriate orders placed.  Edwin Condon Sr. was informed that the remainder of the evaluation will be completed by another provider, this initial triage assessment does not replace that evaluation, and the importance of remaining in the ED until their evaluation is complete.  Clinical Impression  Stable  MSE was initiated and I personally evaluated the patient and placed orders (if any) at  4:22 PM on March 11, 2021.  The patient appears stable so that the remainder of the MSE may be completed by another provider.    Alfredia Client, PA-C 03/11/21 1622
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
## 2021-03-12 LAB — TROPONIN I (HIGH SENSITIVITY): Troponin I (High Sensitivity): 11 ng/L (ref <18)

## 2021-03-12 MED ORDER — CHLORDIAZEPOXIDE HCL 25 MG PO CAPS: ORAL_CAPSULE | ORAL | 0 refills | Status: AC

## 2021-03-12 NOTE — Discharge Instructions (Signed)
You were seen in the emergency department requesting detox.  You are also complaining of some chest pain.  Your labs show that your white blood cell count was mildly low, had this rechecked by your primary care provider.  Your labs and chest pain work-up were overall reassuring.  We are sending home with a Librium taper to help with detox, please take this as prescribed.  Do not drink alcohol with Librium as it can be very dangerous.  Do not take other sedating medications or utilize other substances with this.  We have prescribed you new medication(s) today. Discuss the medications prescribed today with your pharmacist as they can have adverse effects and interactions with your other medicines including over the counter and prescribed medications. Seek medical evaluation if you start to experience new or abnormal symptoms after taking one of these medicines, seek care immediately if you start to experience difficulty breathing, feeling of your throat closing, facial swelling, or rash as these could be indications of a more serious allergic reaction  We provided resources for detox.  We have also consulted our peer support team to help with this, they will contact you sometime today to assist with this.  Please follow-up with attached resources.  Return to the ER for any new or worsening symptoms including but not limited to chest pain, trouble breathing, passing out, change in your vision, numbness, weakness, thoughts of hurting yourself or others, or any other concerns.

## 2021-08-20 ENCOUNTER — Other Ambulatory Visit: Payer: Self-pay

## 2021-08-20 ENCOUNTER — Other Ambulatory Visit: Payer: Self-pay | Admitting: Podiatry

## 2021-08-20 ENCOUNTER — Ambulatory Visit (INDEPENDENT_AMBULATORY_CARE_PROVIDER_SITE_OTHER): Payer: Medicaid Other

## 2021-08-20 ENCOUNTER — Ambulatory Visit (INDEPENDENT_AMBULATORY_CARE_PROVIDER_SITE_OTHER): Payer: Medicaid Other | Admitting: Podiatry

## 2021-08-20 DIAGNOSIS — M7989 Other specified soft tissue disorders: Secondary | ICD-10-CM

## 2021-08-20 DIAGNOSIS — Z09 Encounter for follow-up examination after completed treatment for conditions other than malignant neoplasm: Secondary | ICD-10-CM

## 2021-08-20 DIAGNOSIS — M21622 Bunionette of left foot: Secondary | ICD-10-CM | POA: Diagnosis not present

## 2021-08-20 DIAGNOSIS — M216X2 Other acquired deformities of left foot: Secondary | ICD-10-CM | POA: Diagnosis not present

## 2021-08-20 NOTE — Patient Instructions (Signed)

## 2021-08-22 NOTE — Progress Notes (Signed)
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  He did see another podiatrist and recommended metatarsal head excision apparently.  He states that it did start off small has been getting larger.  No recent injury he reports.  No recent treatment other than try to change shoes and offloading despite that he still gets discomfort.  He is interested in surgical intervention.  Objective: AAO x3, NAD DP/PT pulses palpable bilaterally, CRT less than 3 seconds Soft tissue mass present on the fifth metatarsal head.  It is well-defined.  No fluctuation crepitation.  No erythema or warmth.  Tenderness palpation.  Clinically it appears to be a bony nodule with overlying soft tissue mass. No open lesions or pre-ulcerative lesions.  No pain with calf compression, swelling, warmth, erythema  Assessment: Fifth metatarsal head prominence, soft tissue mass  Plan: -All treatment options discussed with the patient including all alternatives, risks, complications.  -X-rays obtained reviewed.  Previously showed tailor's bunion but soft tissue edema noted as well.  -We discussed with conservative as well as surgical treatment options.  He has tried shoe modifications, offloading padding without significant improvement.  I discussed with him fifth metatarsal head resection, excision soft tissue mass.  On x-ray does not have significant bony prominence but clinically it.  Clinically do not have any signs of infection but needs to monitor this.  He was going to have this performed given the ongoing discomfort. -The incision placement as well as the postoperative course was discussed with the patient. I discussed risks of the surgery which include, but not limited to, infection, bleeding, pain, swelling, need for further surgery, delayed or nonhealing, painful or ugly scar, numbness or sensation changes,  over/under correction, recurrence, transfer lesions, further deformity, hardware failure, DVT/PE, loss of toe/foot. Patient understands these risks and wishes to proceed with surgery. The surgical consent was reviewed with the patient all 3 pages were signed. No promises or guarantees were given to the outcome of the procedure. All questions were answered to the best of my ability. Before the surgery the patient was encouraged to call the office if there is any further questions. The surgery will be performed at the [facility] on an outpatient basis. -Patient encouraged to call the office with any questions, concerns, change in symptoms.

## 2021-09-17 ENCOUNTER — Encounter: Payer: Self-pay | Admitting: Podiatry

## 2021-09-17 ENCOUNTER — Other Ambulatory Visit: Payer: Self-pay | Admitting: Podiatry

## 2021-09-17 DIAGNOSIS — M2012 Hallux valgus (acquired), left foot: Secondary | ICD-10-CM

## 2021-09-17 MED ORDER — CEPHALEXIN 500 MG PO CAPS: 500.0000 mg | ORAL_CAPSULE | Freq: Three times a day (TID) | ORAL | 0 refills | Status: DC

## 2021-09-17 MED ORDER — PROMETHAZINE HCL 25 MG PO TABS: 25.0000 mg | ORAL_TABLET | Freq: Three times a day (TID) | ORAL | 0 refills | Status: DC | PRN

## 2021-09-17 MED ORDER — HYDROCODONE-ACETAMINOPHEN 5-325 MG PO TABS: 1.0000 | ORAL_TABLET | Freq: Four times a day (QID) | ORAL | 0 refills | Status: DC | PRN

## 2021-09-17 MED ORDER — HYDROCODONE-ACETAMINOPHEN 5-325 MG PO TABS: 1.0000 | ORAL_TABLET | Freq: Four times a day (QID) | ORAL | 0 refills | Status: AC | PRN

## 2021-09-17 MED ORDER — PROMETHAZINE HCL 25 MG PO TABS: 25.0000 mg | ORAL_TABLET | Freq: Three times a day (TID) | ORAL | 0 refills | Status: AC | PRN

## 2021-09-17 NOTE — Progress Notes (Signed)
Postop medications sent 

## 2021-09-17 NOTE — Progress Notes (Signed)
Resent medications  

## 2021-09-22 ENCOUNTER — Other Ambulatory Visit: Payer: Self-pay

## 2021-09-22 ENCOUNTER — Ambulatory Visit (INDEPENDENT_AMBULATORY_CARE_PROVIDER_SITE_OTHER): Payer: Medicaid Other | Admitting: Podiatry

## 2021-09-22 ENCOUNTER — Ambulatory Visit (INDEPENDENT_AMBULATORY_CARE_PROVIDER_SITE_OTHER): Payer: Medicaid Other

## 2021-09-22 DIAGNOSIS — M21622 Bunionette of left foot: Secondary | ICD-10-CM | POA: Diagnosis not present

## 2021-09-22 DIAGNOSIS — Z9889 Other specified postprocedural states: Secondary | ICD-10-CM

## 2021-09-22 MED ORDER — TRAMADOL HCL 50 MG PO TABS: 50.0000 mg | ORAL_TABLET | Freq: Three times a day (TID) | ORAL | 0 refills | Status: AC | PRN

## 2021-09-24 NOTE — Progress Notes (Signed)
Subjective: Edwin Delaughter Sr. is a 50 y.o. is seen today in office s/p left foot fifth metatarsal head resection preformed on 09/17/2021.  States he is having some pain.  The hydrocodone was given for headache. No recent injury. Denies any systemic complaints such as fevers, chills, nausea, vomiting. No calf pain, chest pain, shortness of breath.   Objective: General: No acute distress, AAOx3  DP/PT pulses palpable 2/4, CRT < 3 sec to all digits.  Protective sensation intact. Motor function intact.  LEFT foot: Incision is well coapted without any evidence of dehiscence with sutures intact. There is no surrounding erythema, ascending cellulitis, fluctuance, crepitus, malodor, drainage/purulence. There is mild edema around the surgical site. There is mild pain along the surgical site.  No other areas of tenderness to bilateral lower extremities.  No other open lesions or pre-ulcerative lesions.  No pain with calf compression, swelling, warmth, erythema.   Assessment and Plan:  Status post left foot fifth metatarsal head resection, doing well with no complications   -Treatment options discussed including all alternatives, risks, and complications -X-rays were obtained and reviewed with the patient. Status post 5th metatarsal head resection -Antibiotic ointment and bandage applied. Can keep the dressing clean, dry, intact.  -Continue surgical shoe. -Ice/elevation -Pain medication as needed. -Monitor for any clinical signs or symptoms of infection and DVT/PE and directed to call the office immediately should any occur or go to the ER. -Follow-up as scheduled for possible suture removal or sooner if any problems arise. In the meantime, encouraged to call the office with any questions, concerns, change in symptoms.   Celesta Gentile, DPM

## 2021-09-30 IMAGING — CR DG CHEST 2V
2 series · 2 of 2 positions shown · non-contrast
Comparison: 03/10/2021

CLINICAL DATA: Chest pain

EXAM:
CHEST - 2 VIEW

[chest pa]
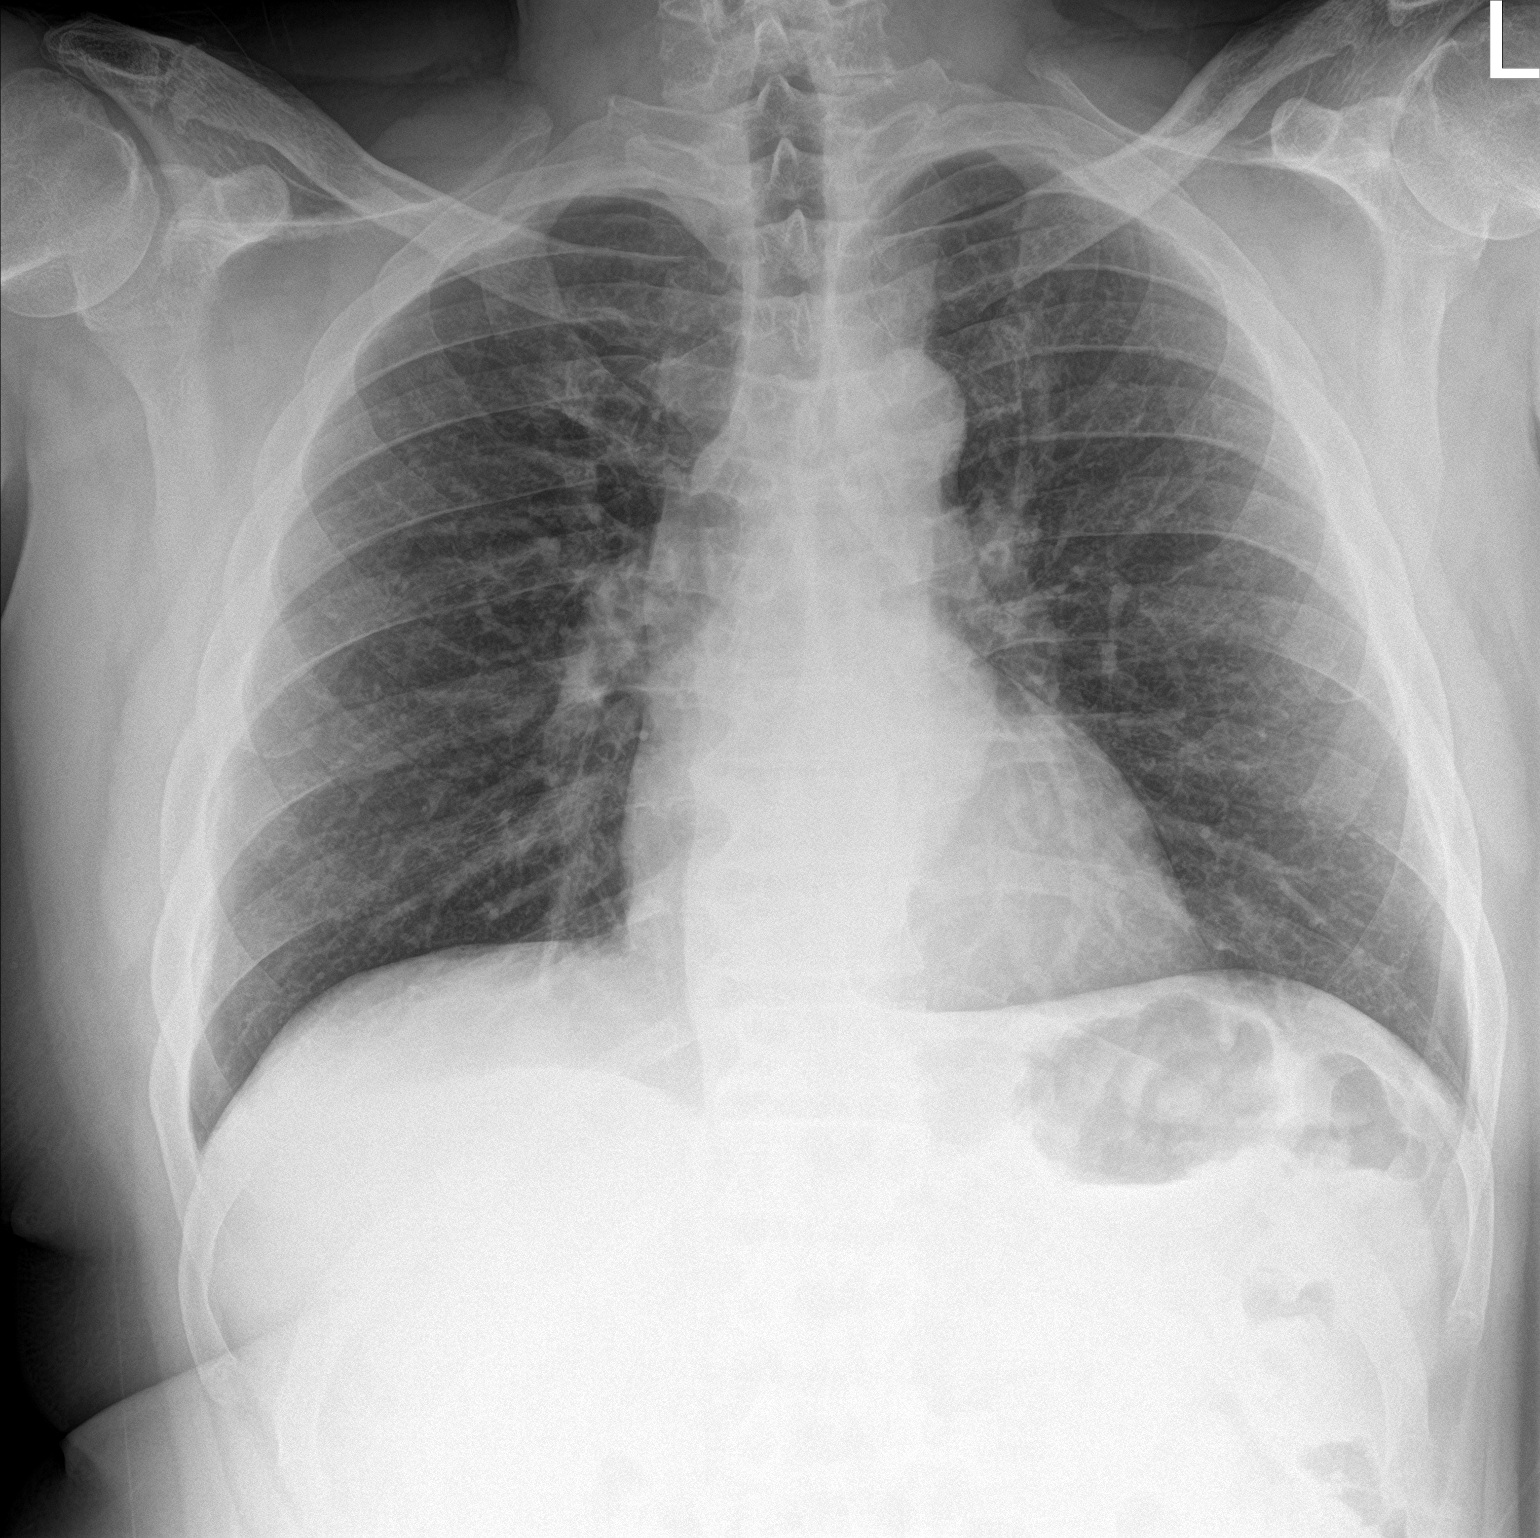

[chest lat]
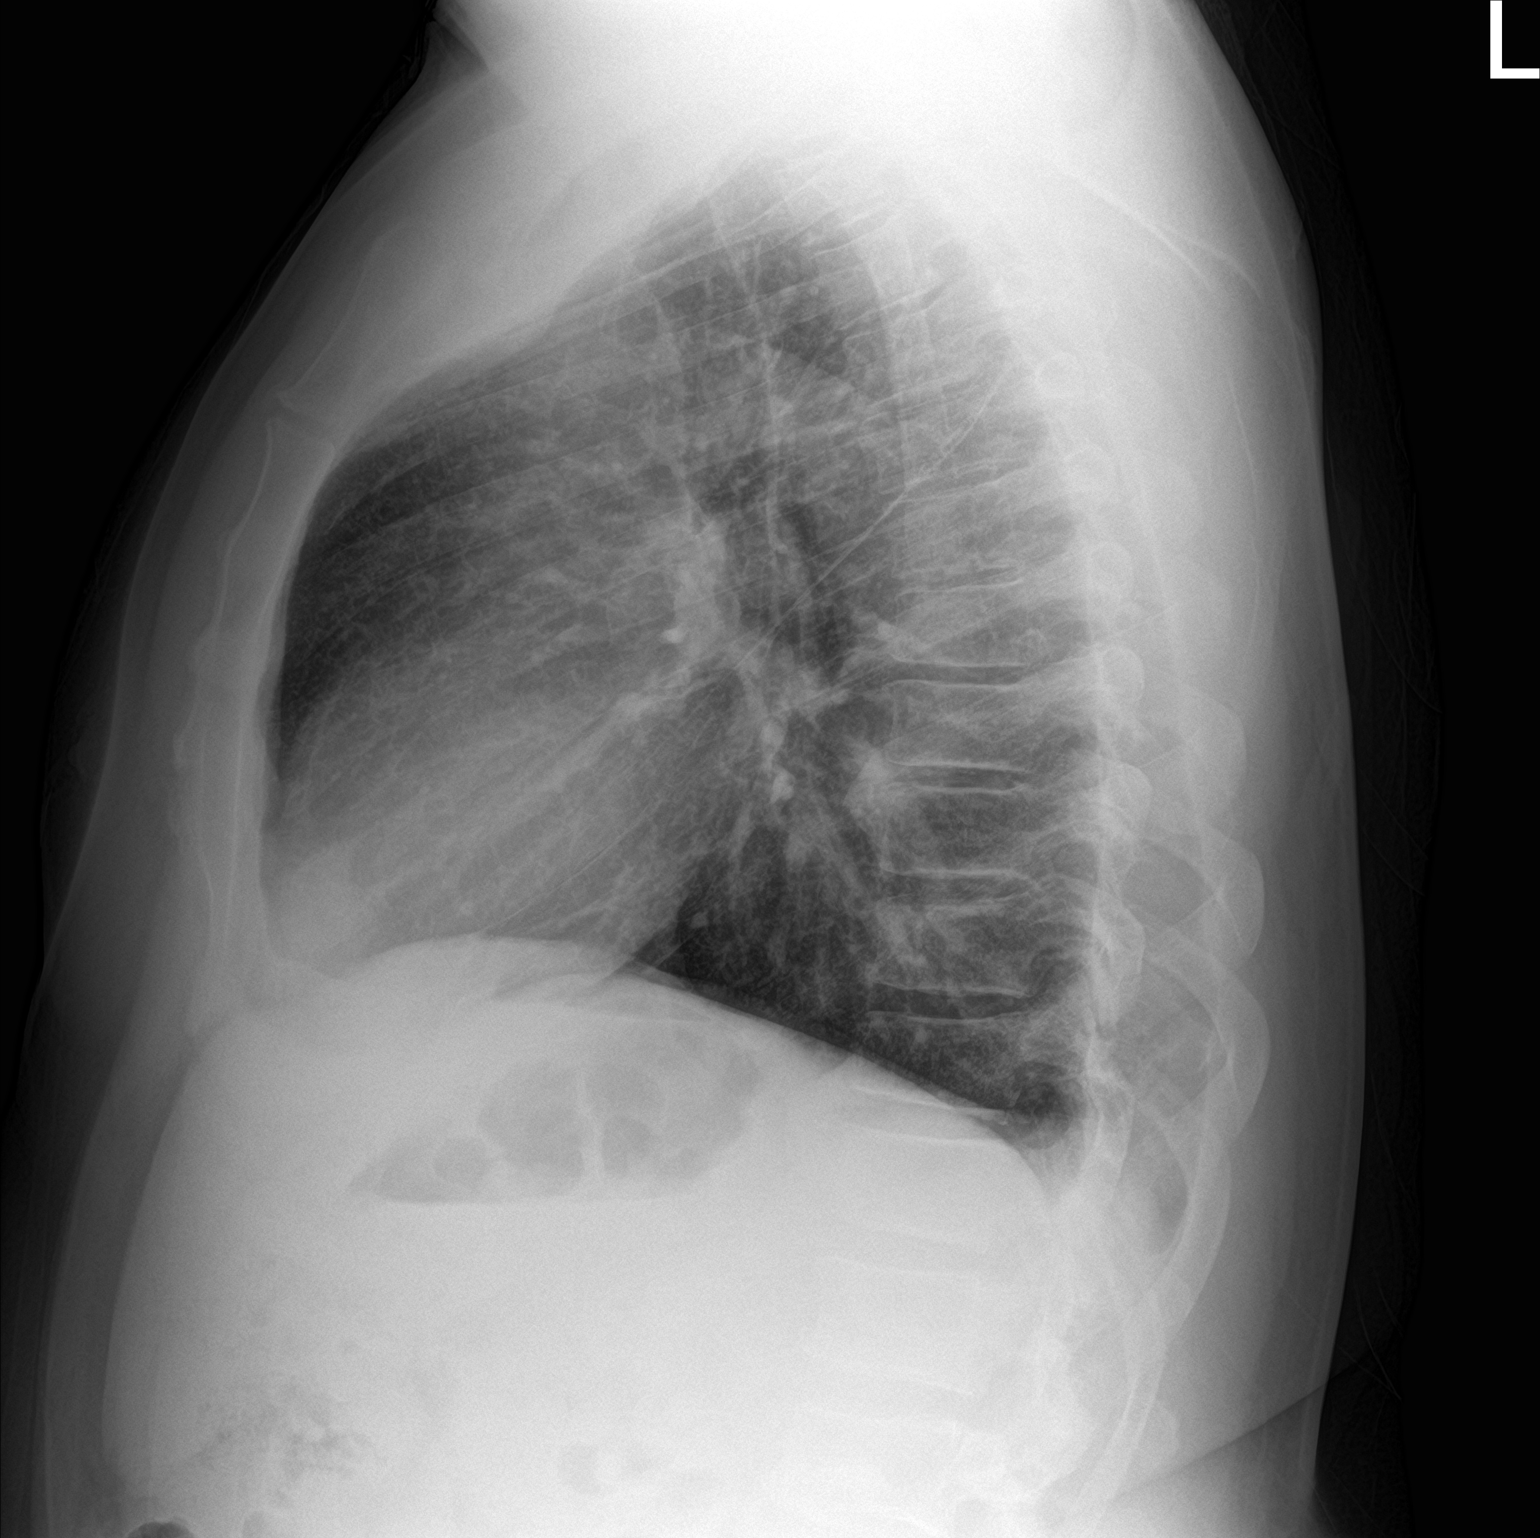

[2 of 2 positions shown; findings below may reference images not displayed]

FINDINGS: The heart size and mediastinal contours are within normal limits.
Both lungs are clear. The visualized skeletal structures are
unremarkable.
IMPRESSION: Negative.

## 2021-10-02 ENCOUNTER — Ambulatory Visit (INDEPENDENT_AMBULATORY_CARE_PROVIDER_SITE_OTHER): Payer: Medicaid Other | Admitting: Podiatry

## 2021-10-02 ENCOUNTER — Other Ambulatory Visit: Payer: Self-pay

## 2021-10-02 DIAGNOSIS — M7989 Other specified soft tissue disorders: Secondary | ICD-10-CM

## 2021-10-02 DIAGNOSIS — Z9889 Other specified postprocedural states: Secondary | ICD-10-CM

## 2021-10-02 DIAGNOSIS — M21622 Bunionette of left foot: Secondary | ICD-10-CM

## 2021-10-05 NOTE — Progress Notes (Signed)
Subjective: Edwin Hoffer Sr. is a 50 y.o. is seen today in office s/p left foot fifth metatarsal head resection preformed on 09/17/2021.  He presents today for suture removal.  7 some occasional discomfort but overall improving.  Denies any fevers or chills.  No nausea or vomiting.  No other concerns today.   Objective: General: No acute distress, AAOx3  DP/PT pulses palpable 2/4, CRT < 3 sec to all digits.  Protective sensation intact. Motor function intact.  LEFT foot: Incision is well coapted without any evidence of dehiscence with sutures intact.  There is minimal edema.  There is no erythema or warmth.  No drainage or pus or any obvious signs of infection noted today. No other open lesions or pre-ulcerative lesions.  No pain with calf compression, swelling, warmth, erythema.   Assessment and Plan:  Status post left foot fifth metatarsal head resection, doing well with no complications   -Treatment options discussed including all alternatives, risks, and complications -Sutures removed today without complications incisions well coapted without any signs of infection or dehiscence.  Steri-Strips applied for reinforcement.  Discussed that he can start to shower and wash the foot with soap and water daily but do not soak the foot.  Continue surgical shoe for offloading, ice elevation. Monitor for any clinical signs or symptoms of infection and directed to call the office immediately should any occur or go to the ER.   Trula Slade DPM

## 2021-10-20 ENCOUNTER — Other Ambulatory Visit: Payer: Self-pay

## 2021-10-20 ENCOUNTER — Ambulatory Visit (INDEPENDENT_AMBULATORY_CARE_PROVIDER_SITE_OTHER): Payer: Medicaid Other

## 2021-10-20 ENCOUNTER — Ambulatory Visit (INDEPENDENT_AMBULATORY_CARE_PROVIDER_SITE_OTHER): Payer: Medicaid Other | Admitting: Podiatry

## 2021-10-20 ENCOUNTER — Encounter: Payer: Self-pay | Admitting: Podiatry

## 2021-10-20 DIAGNOSIS — M21622 Bunionette of left foot: Secondary | ICD-10-CM

## 2021-10-20 DIAGNOSIS — M216X2 Other acquired deformities of left foot: Secondary | ICD-10-CM | POA: Diagnosis not present

## 2021-10-20 DIAGNOSIS — Z9889 Other specified postprocedural states: Secondary | ICD-10-CM

## 2021-10-20 MED ORDER — TRAMADOL HCL 50 MG PO TABS: 50.0000 mg | ORAL_TABLET | Freq: Two times a day (BID) | ORAL | 0 refills | Status: DC | PRN

## 2021-10-20 MED ORDER — TRAMADOL HCL 50 MG PO TABS: 50.0000 mg | ORAL_TABLET | Freq: Two times a day (BID) | ORAL | 0 refills | Status: AC | PRN

## 2021-10-22 NOTE — Progress Notes (Signed)
Subjective: Edwin Ahlers Sr. is a 50 y.o. is seen today in office s/p left foot fifth metatarsal head resection preformed on 09/17/2021.  States he has been doing well.  Having some occasional discomfort and asking for refill of tramadol.  No fevers or chills no increase in swelling or redness that he has noted.  He has no other concerns.    Objective: General: No acute distress, AAOx3  DP/PT pulses palpable 2/4, CRT < 3 sec to all digits.  Protective sensation intact. Motor function intact.  LEFT foot: Incision is well coapted without any evidence of dehiscence and scar starting to form there is also a scab on the incision although mild.  There is no drainage or pus.  No surrounding erythema, ascending cellulitis.  No fluctuance crepitation identified.  Slight discomfort to palpation at surgical site but no other areas of discomfort.  No pain with calf compression, swelling, warmth, erythema.   Assessment and Plan:  Status post left foot fifth metatarsal head resection, doing well with no complications   -Treatment options discussed including all alternatives, risks, and complications -Incisions healing well with small scab is still present.  Recommend moisturizer to the area daily.  I remain in surgical shoe until it has completely come off and scar is well formed and he can start to transition to regular shoe as tolerated.  Continue ice elevate. -Prescribed tramadol.  Discussed this will likely be the last prescription of pain medication.  -Monitor for any clinical signs or symptoms of infection and directed to call the office immediately should any occur or go to the ER.  Return in about 3 weeks (around 11/10/2021).  Trula Slade DPM

## 2021-11-10 ENCOUNTER — Ambulatory Visit (INDEPENDENT_AMBULATORY_CARE_PROVIDER_SITE_OTHER): Payer: Medicaid Other | Admitting: Podiatry

## 2021-11-10 ENCOUNTER — Other Ambulatory Visit: Payer: Self-pay

## 2021-11-10 DIAGNOSIS — M21622 Bunionette of left foot: Secondary | ICD-10-CM

## 2021-11-10 DIAGNOSIS — Z9889 Other specified postprocedural states: Secondary | ICD-10-CM

## 2021-11-11 NOTE — Progress Notes (Signed)
Subjective: Edwin Hemphill Sr. is a 50 y.o. is seen today in office s/p left foot fifth metatarsal head resection preformed on 09/17/2021.  States he been doing better.  Denies any increase in swelling or redness.  Incisions healing well.  No recent injury or falls.  Still wearing surgical shoe. No fevers or chills.   Objective: General: No acute distress, AAOx3  DP/PT pulses palpable 2/4, CRT < 3 sec to all digits.  Protective sensation intact. Motor function intact.  LEFT foot: Incision is well coapted without any evidence of dehiscence and scar has formed.  Small, callus formation but no ongoing ulceration or signs of infection.  No surrounding erythema, ascending cellulitis.  No fluctuance or crepitation was noted.   No pain with calf compression, swelling, warmth, erythema.   Assessment and Plan:  Status post left foot fifth metatarsal head resection, doing well with no complications   -Treatment options discussed including all alternatives, risks, and complications -Incisions healing well with small scab is still present.  Recommend moisturizer to the area daily.  I remain in surgical shoe until it has completely come off and scar is well formed and he can start to transition to regular shoe as tolerated.  Continue ice elevate. -Prescribed tramadol.  Discussed this will likely be the last prescription of pain medication.  -Monitor for any clinical signs or symptoms of infection and directed to call the office immediately should any occur or go to the ER.  Return in about 3 weeks (around 11/10/2021).  Trula Slade DPM

## 2021-12-02 ENCOUNTER — Ambulatory Visit (INDEPENDENT_AMBULATORY_CARE_PROVIDER_SITE_OTHER): Payer: Medicaid Other | Admitting: Podiatry

## 2021-12-02 ENCOUNTER — Other Ambulatory Visit: Payer: Self-pay

## 2021-12-02 ENCOUNTER — Ambulatory Visit (INDEPENDENT_AMBULATORY_CARE_PROVIDER_SITE_OTHER): Payer: Medicaid Other

## 2021-12-02 DIAGNOSIS — M21622 Bunionette of left foot: Secondary | ICD-10-CM

## 2021-12-02 DIAGNOSIS — Z9889 Other specified postprocedural states: Secondary | ICD-10-CM

## 2021-12-02 DIAGNOSIS — M216X2 Other acquired deformities of left foot: Secondary | ICD-10-CM | POA: Diagnosis not present

## 2021-12-07 NOTE — Progress Notes (Signed)
Subjective: Edwin Winnett Sr. is a 51 y.o. is seen today in office s/p left foot fifth metatarsal head resection preformed on 09/17/2021.  States he is doing well not having any pain.  No significant swelling.  No redness or any concerns.  He is back to her regular shoe.  No fevers or chills.   Objective: General: No acute distress, AAOx3  DP/PT pulses palpable 2/4, CRT < 3 sec to all digits.  Protective sensation intact. Motor function intact.  LEFT foot: Incision is well coapted without any evidence of dehiscence and scar has formed.  There is no tenderness along the surgical site.  Minimal edema.  No erythema warmth or any signs of infection. No pain with calf compression, swelling, warmth, erythema.   Assessment and Plan:  Status post left foot fifth metatarsal head resection, doing well with no complications   -Treatment options discussed including all alternatives, risks, and complications -X-rays today reviewed.  No evidence of acute fracture.  Status post fifth metatarsal head resection and the margins are sharp. -Incisions well-healed and scars present.  Overall back to wearing regular shoes not having any pain.  Discussed with him gradual increase in activity level as tolerated.  We will discharge him from the postoperative course and he agrees with this plan.  Encouraged to call with any changes.  Trula Slade DPM
